# Patient Record
Sex: Female | Born: 1935 | Race: White | Hispanic: No | Marital: Single | Smoking: Never smoker
Health system: Southern US, Community
[De-identification: ages and names within clinical notes are randomized; demographics above are authoritative.]

## PROBLEM LIST (undated history)

## (undated) DIAGNOSIS — M1990 Unspecified osteoarthritis, unspecified site: Secondary | ICD-10-CM

## (undated) DIAGNOSIS — E119 Type 2 diabetes mellitus without complications: Secondary | ICD-10-CM

## (undated) DIAGNOSIS — F419 Anxiety disorder, unspecified: Secondary | ICD-10-CM

## (undated) DIAGNOSIS — I10 Essential (primary) hypertension: Secondary | ICD-10-CM

## (undated) DIAGNOSIS — G4730 Sleep apnea, unspecified: Secondary | ICD-10-CM

## (undated) HISTORY — DX: Unspecified osteoarthritis, unspecified site: M19.90

## (undated) HISTORY — DX: Essential (primary) hypertension: I10

## (undated) HISTORY — PX: TONSILLECTOMY AND ADENOIDECTOMY: SUR1326

## (undated) HISTORY — DX: Type 2 diabetes mellitus without complications: E11.9

## (undated) HISTORY — PX: TUBAL LIGATION: SHX77

## (undated) HISTORY — DX: Anxiety disorder, unspecified: F41.9

## (undated) HISTORY — PX: REPLACEMENT TOTAL KNEE: SUR1224

## (undated) HISTORY — DX: Sleep apnea, unspecified: G47.30

## (undated) HISTORY — PX: APPENDECTOMY: SHX54

## (undated) HISTORY — PX: CATARACT EXTRACTION, BILATERAL: SHX1313

---

## 2005-04-13 ENCOUNTER — Ambulatory Visit: Payer: Self-pay | Admitting: Internal Medicine

## 2005-11-05 ENCOUNTER — Emergency Department: Payer: Self-pay | Admitting: Emergency Medicine

## 2005-11-05 ENCOUNTER — Other Ambulatory Visit: Payer: Self-pay

## 2005-11-12 ENCOUNTER — Emergency Department: Payer: Self-pay | Admitting: Emergency Medicine

## 2006-12-15 ENCOUNTER — Ambulatory Visit: Payer: Self-pay | Admitting: Internal Medicine

## 2007-04-11 ENCOUNTER — Encounter: Payer: Self-pay | Admitting: Orthopedic Surgery

## 2007-04-27 ENCOUNTER — Encounter: Payer: Self-pay | Admitting: Orthopedic Surgery

## 2007-05-28 ENCOUNTER — Encounter: Payer: Self-pay | Admitting: Orthopedic Surgery

## 2007-06-27 ENCOUNTER — Encounter: Payer: Self-pay | Admitting: Orthopedic Surgery

## 2007-12-21 ENCOUNTER — Ambulatory Visit: Payer: Self-pay | Admitting: Internal Medicine

## 2009-02-14 ENCOUNTER — Ambulatory Visit: Payer: Self-pay | Admitting: Internal Medicine

## 2010-03-20 ENCOUNTER — Ambulatory Visit: Payer: Self-pay | Admitting: Internal Medicine

## 2010-04-09 ENCOUNTER — Observation Stay: Payer: Self-pay | Admitting: Internal Medicine

## 2010-05-21 ENCOUNTER — Emergency Department: Payer: Self-pay | Admitting: Emergency Medicine

## 2010-05-29 ENCOUNTER — Emergency Department (HOSPITAL_COMMUNITY): Admission: EM | Admit: 2010-05-29 | Discharge: 2010-05-29 | Payer: Self-pay | Admitting: Emergency Medicine

## 2010-05-31 ENCOUNTER — Ambulatory Visit: Payer: Self-pay | Admitting: Internal Medicine

## 2010-06-11 ENCOUNTER — Ambulatory Visit: Payer: Self-pay

## 2010-10-08 LAB — POCT I-STAT, CHEM 8
BUN: 6 mg/dL (ref 6–23)
Calcium, Ion: 1.29 mmol/L (ref 1.12–1.32)
Chloride: 107 mEq/L (ref 96–112)
Creatinine, Ser: 0.9 mg/dL (ref 0.4–1.2)
Glucose, Bld: 102 mg/dL — ABNORMAL HIGH (ref 70–99)

## 2010-10-08 LAB — URINALYSIS, ROUTINE W REFLEX MICROSCOPIC
Ketones, ur: NEGATIVE mg/dL
Nitrite: NEGATIVE
Protein, ur: NEGATIVE mg/dL
Urobilinogen, UA: 0.2 mg/dL (ref 0.0–1.0)
pH: 6.5 (ref 5.0–8.0)

## 2011-03-23 IMAGING — CT CT HEAD WITHOUT CONTRAST
2 series · 15 of 30 positions shown, 19 images · non-contrast
Comparison: none

REASON FOR EXAM: weakness/ syncope
COMMENTS:

PROCEDURE:     CT  - CT HEAD WITHOUT CONTRAST  - April 09, 2010  [DATE]
RESULT:     Comparison:  None
TECHNIQUE: Multiple axial images from the foramen magnum to the vertex were
obtained without IV contrast.

[Series 2: without · axial · non-contrast · 0.44mm/px · z∈[-175,-50]mm · 13 of 31 slices shown, 17 images]
[im 3/31  brain]
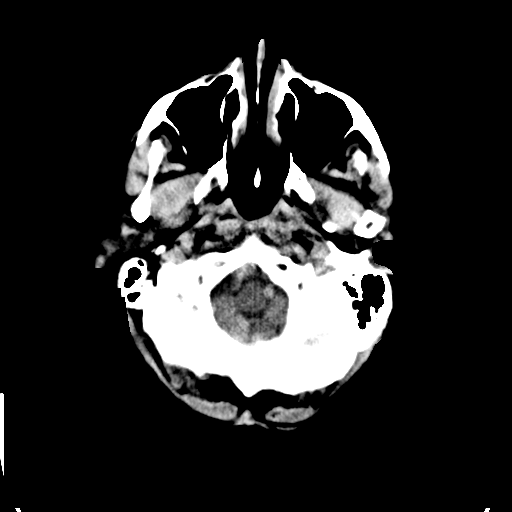
[im 3/31  bone]
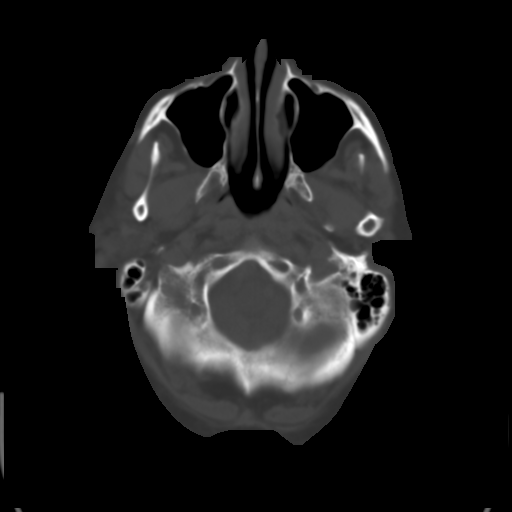
[im 5/31  brain]
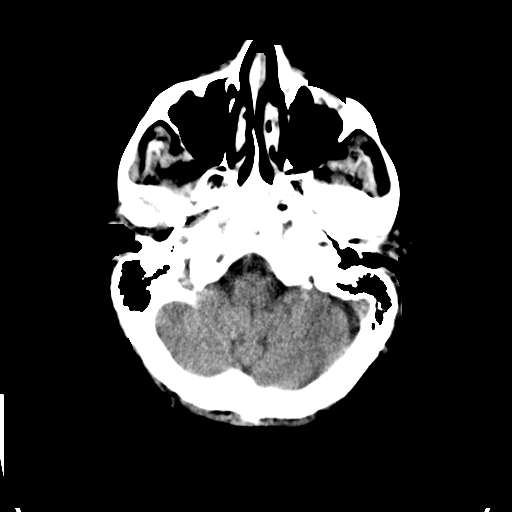
[im 7/31  brain]
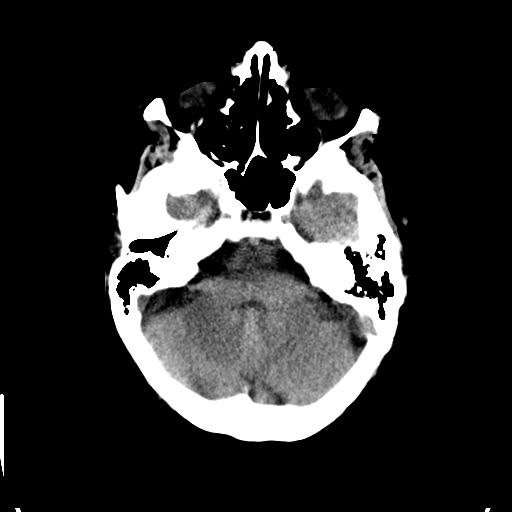
[im 9/31  brain]
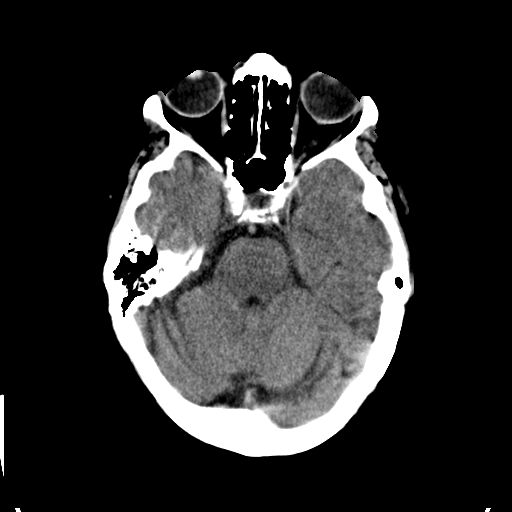
[im 11/31  brain]
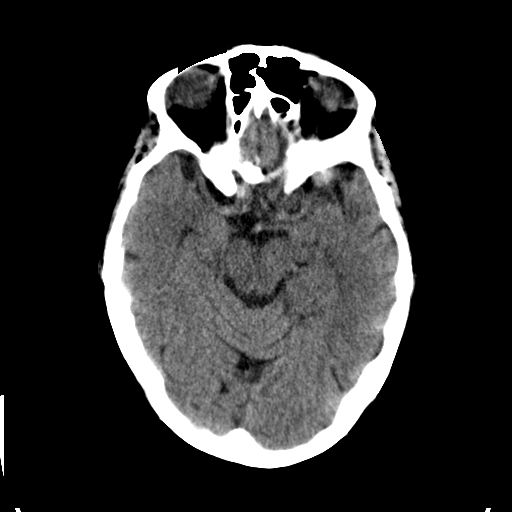
[im 11/31  bone]
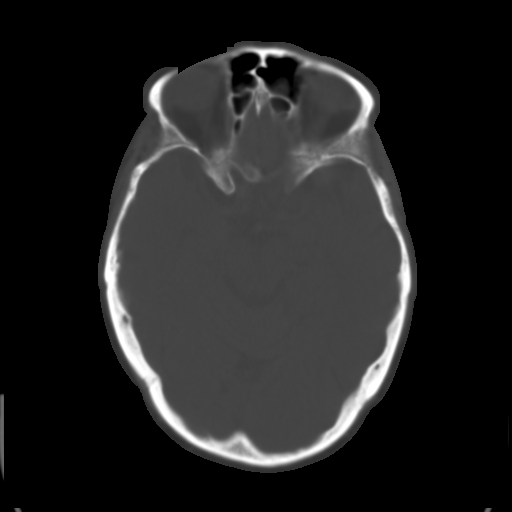
[im 13/31  brain]
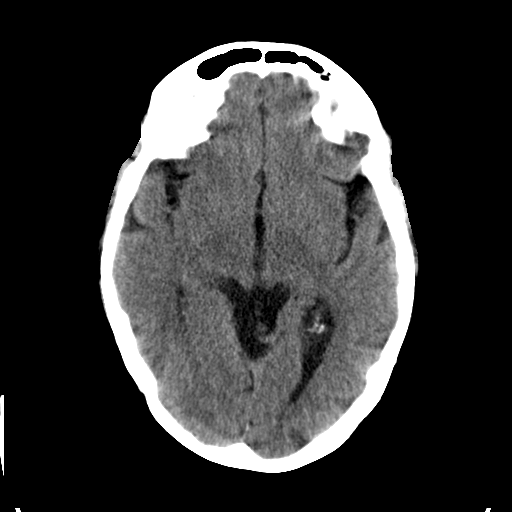
[im 16/31  brain]
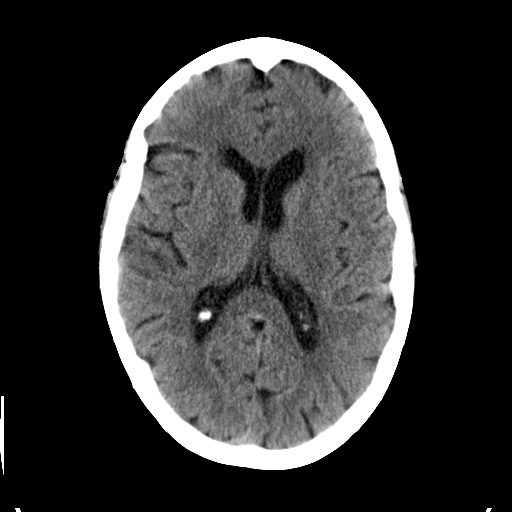
[im 18/31  brain]
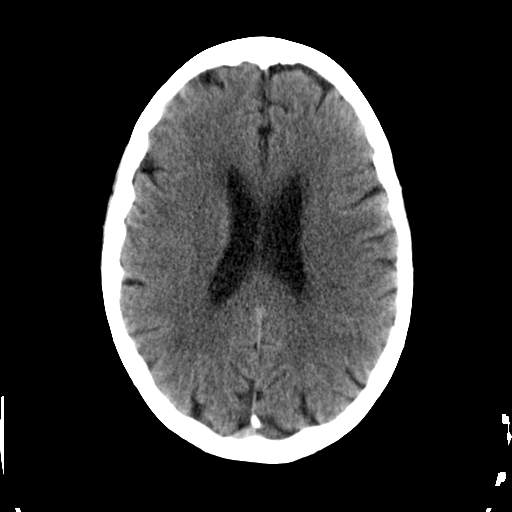
[im 20/31  brain]
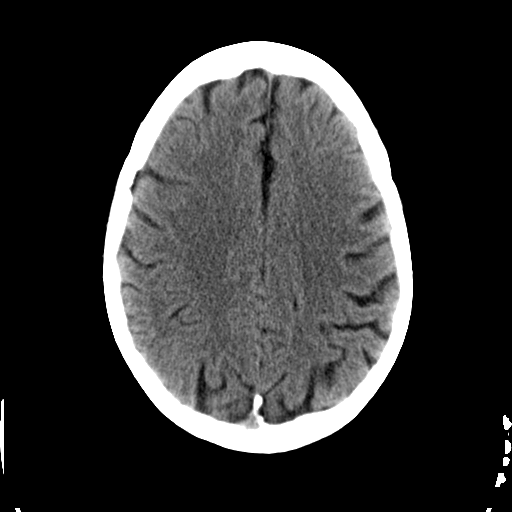
[im 20/31  bone]
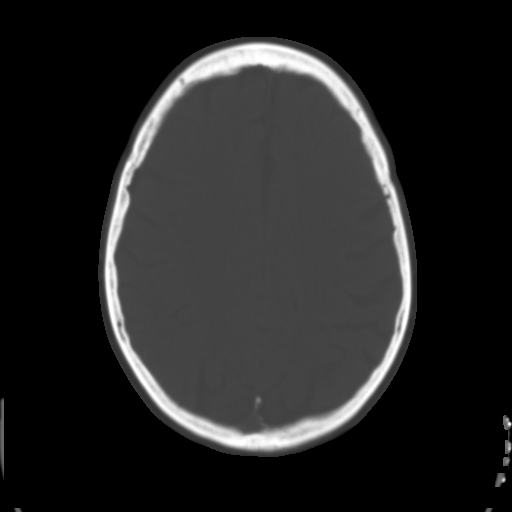
[im 22/31  brain]
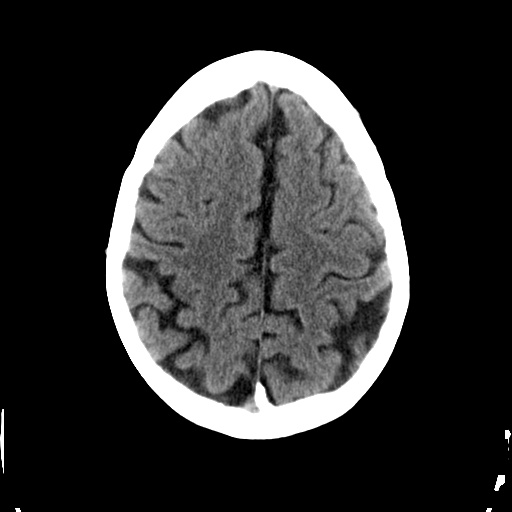
[im 24/31  brain]
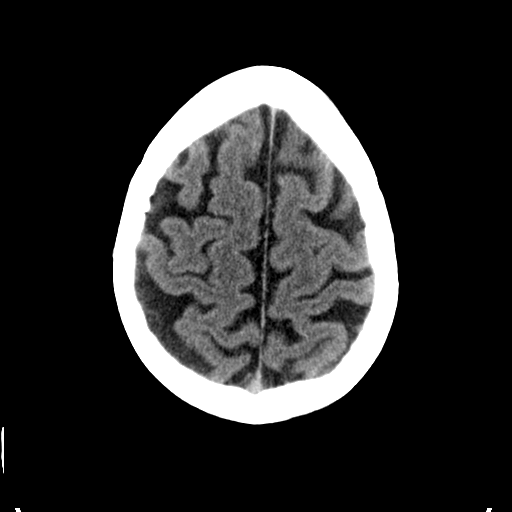
[im 26/31  brain]
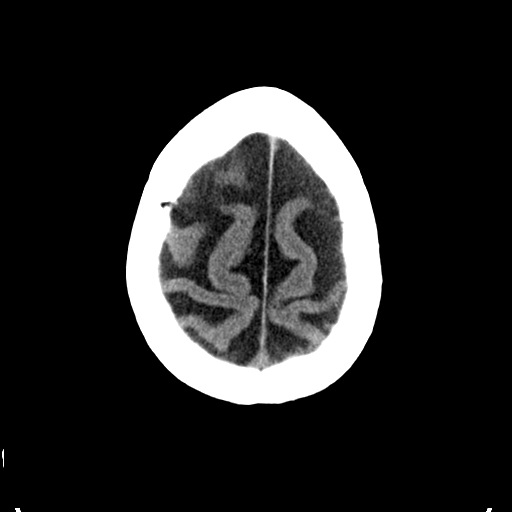
[im 28/31  brain]
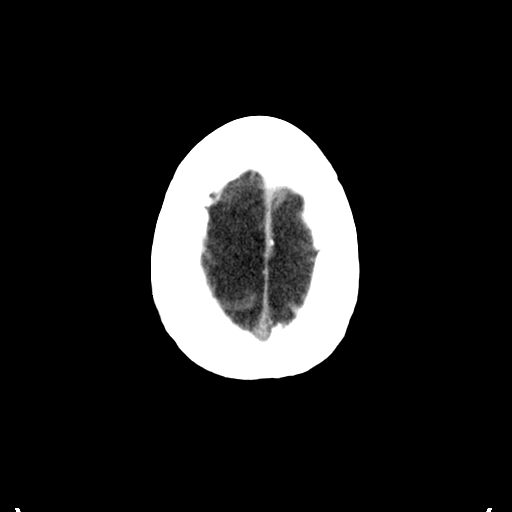
[im 28/31  bone]
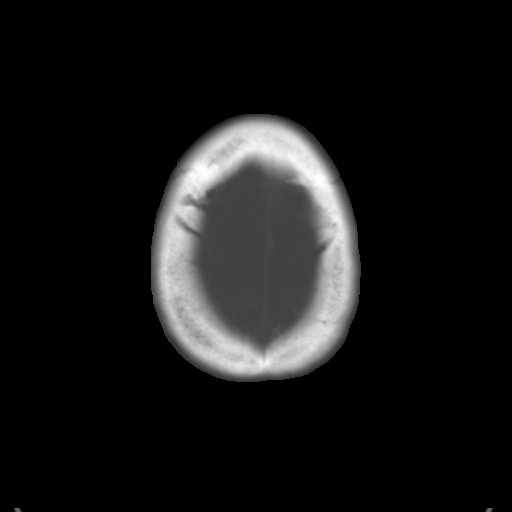

[Series 3: bone · axial · 0.44mm/px · z∈[-175,-155]mm · 2 of 31 slices shown]
[im 3/31  bone]
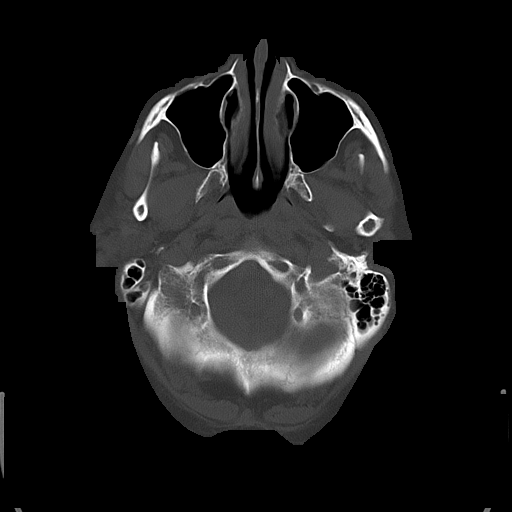
[im 7/31  bone]
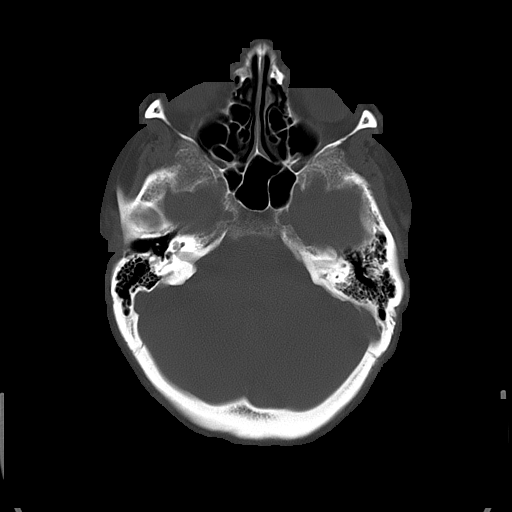

[15 of 30 positions shown; findings below may reference images not displayed]

FINDINGS: There is no evidence of mass effect, midline shift, or extra-axial fluid
collections.  There is no evidence of a space-occupying lesion or
intracranial hemorrhage. There is no evidence of a cortical-based area of
acute infarction. There is generalized cerebral atrophy. There is
periventricular white matter low attenuation likely secondary to
microangiopathy.

The ventricles and sulci are appropriate for the patient's age. The basal
cisterns are patent.

Visualized portions of the orbits are unremarkable. The visualized portions
of the paranasal sinuses and mastoid air cells are unremarkable.
Cerebrovascular atherosclerotic calcifications are noted.

The osseous structures are unremarkable.
IMPRESSION: No acute intracranial process.

## 2011-03-23 IMAGING — CT CT CHEST W/ CM
1 series · 15 of 34 positions shown, 19 images · IV contrast (APPLIED)
Comparison: None

REASON FOR EXAM: eval aorta and for pulnonary emboli
COMMENTS:

PROCEDURE:     CT  - CT CHEST (FOR PE) W  - April 09, 2010 [DATE]
RESULT:     Indications: Syncope
TECHNIQUE: A thin-section spiral CT from the lung apices to the upper
abdomen was acquired on a multi slice scanner following 100ml [id number]
intravenous contrast. These images were then transferred to the Siemens work
station and were subsequently reviewed utilizing 3-D reconstructions and MIP
images.

[Series 4: soft tissue · axial · 0.66mm/px · z∈[-834,-568]mm · 15 of 105 slices shown, 19 images]
[im 8/105  mediastinal]
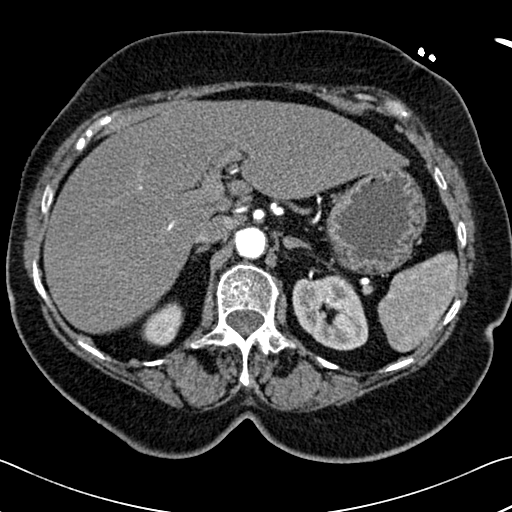
[im 8/105  lung]
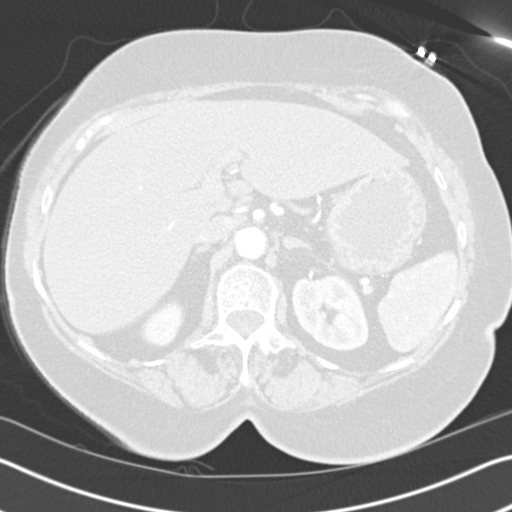
[im 16/105  lung]
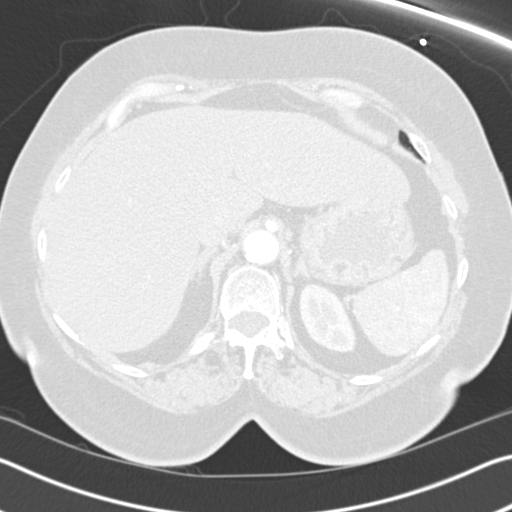
[im 21/105  lung]
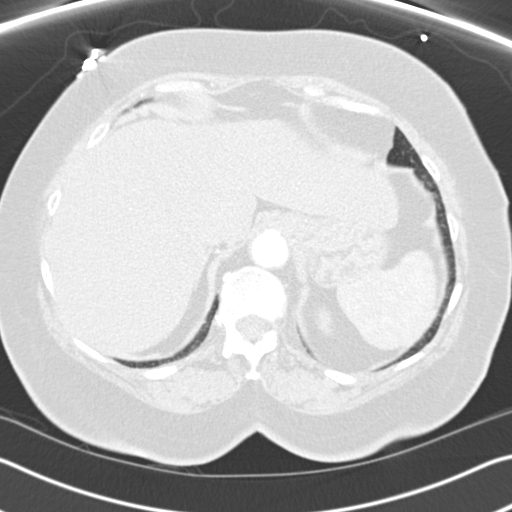
[im 27/105  lung]
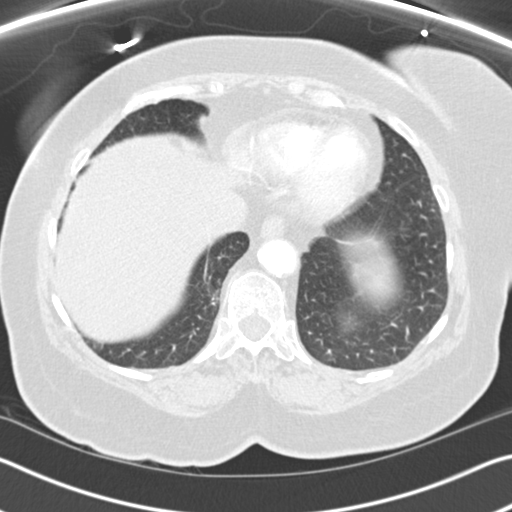
[im 35/105  mediastinal]
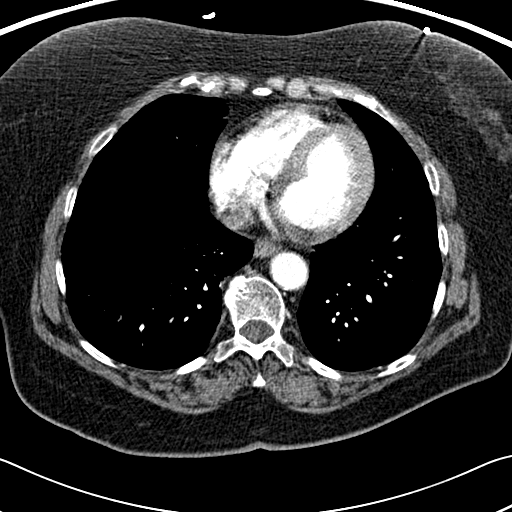
[im 35/105  lung]
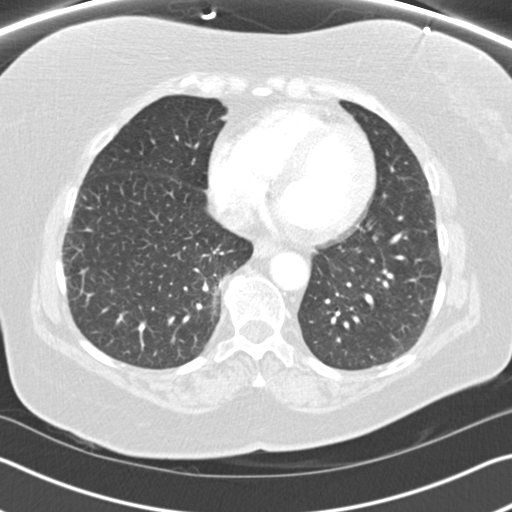
[im 42/105  lung]
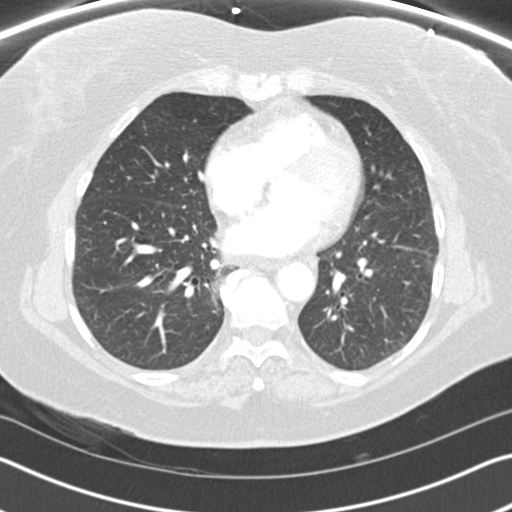
[im 47/105  lung]
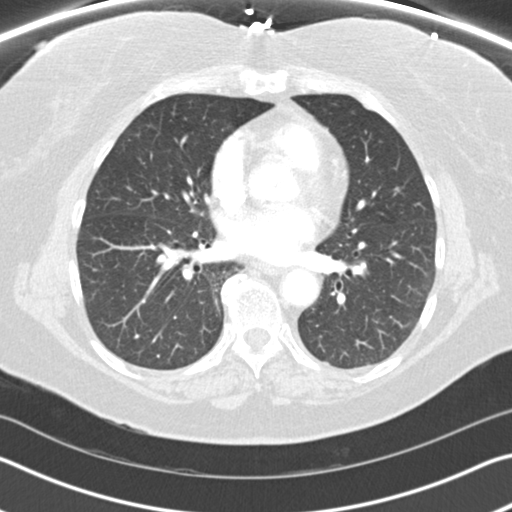
[im 54/105  lung]
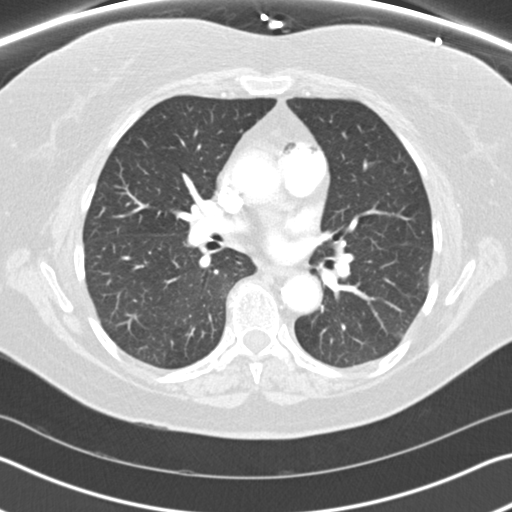
[im 58/105  mediastinal]
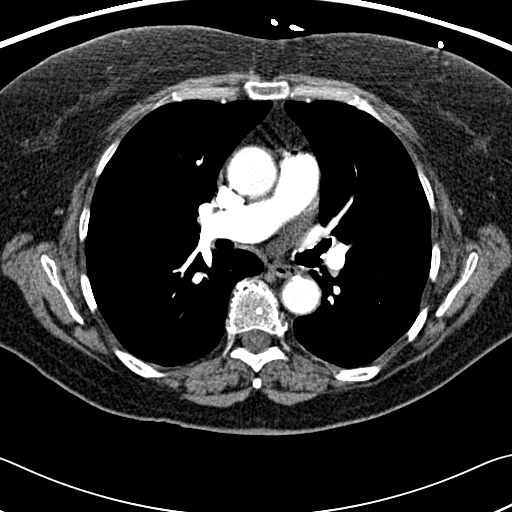
[im 58/105  lung]
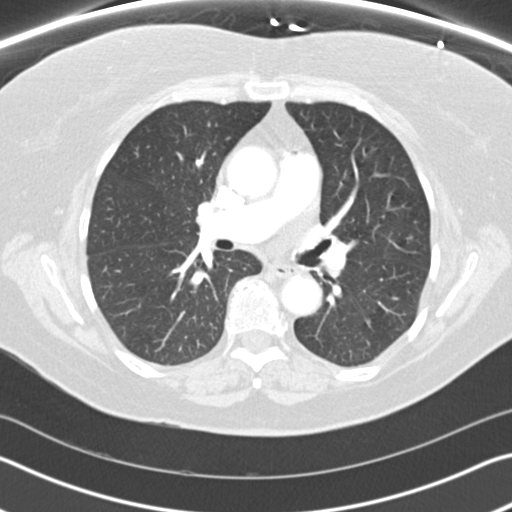
[im 63/105  lung]
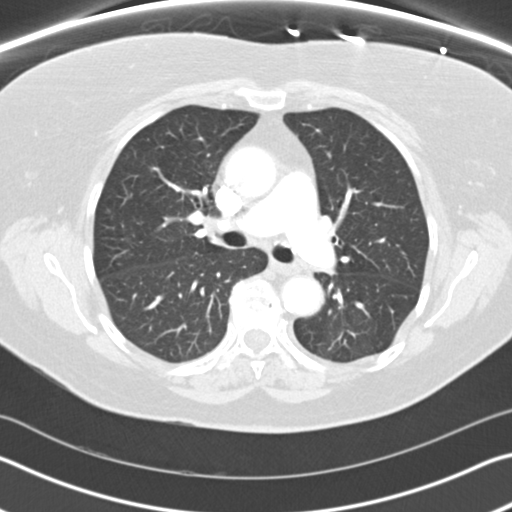
[im 70/105  lung]
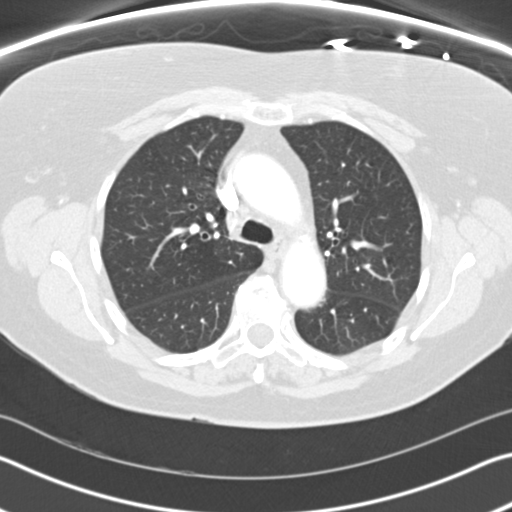
[im 78/105  lung]
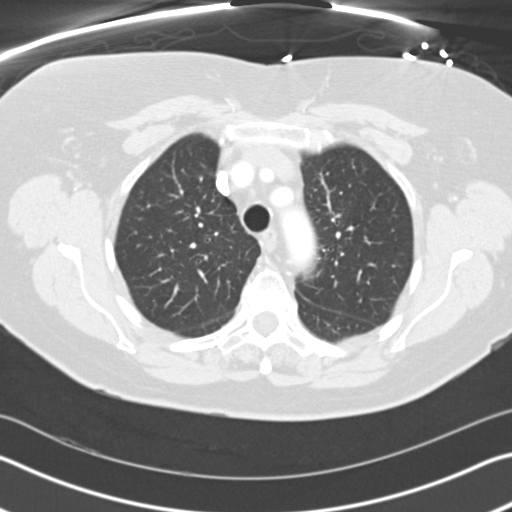
[im 84/105  mediastinal]
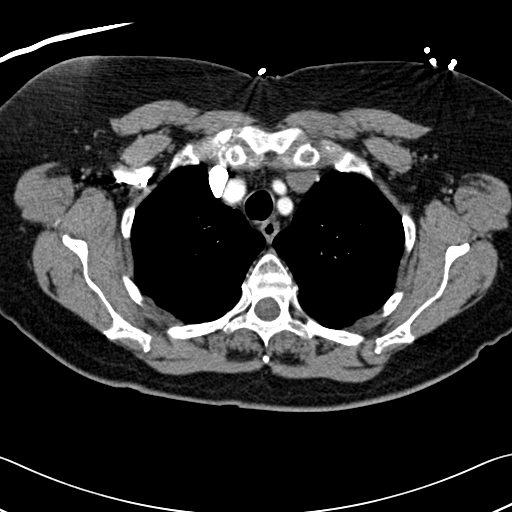
[im 84/105  lung]
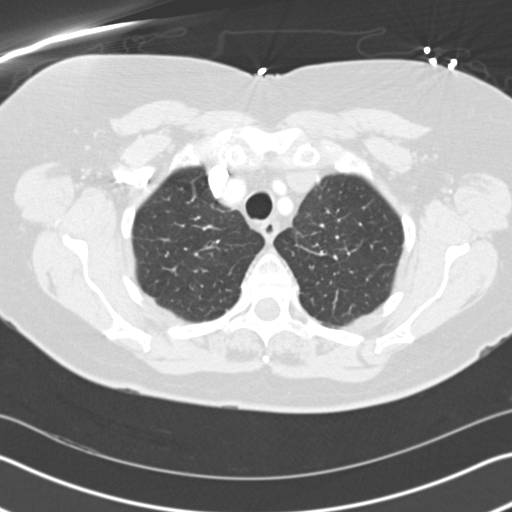
[im 89/105  lung]
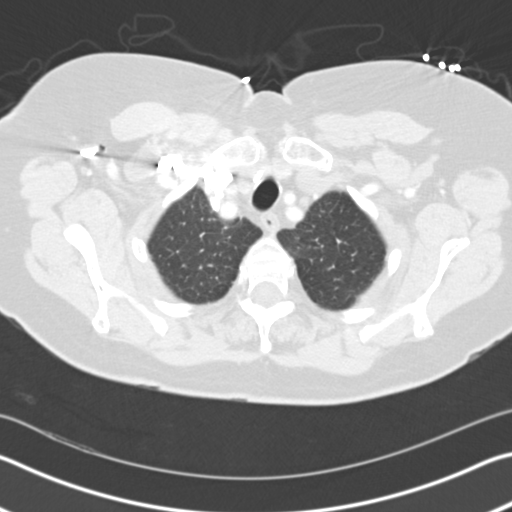
[im 97/105  lung]
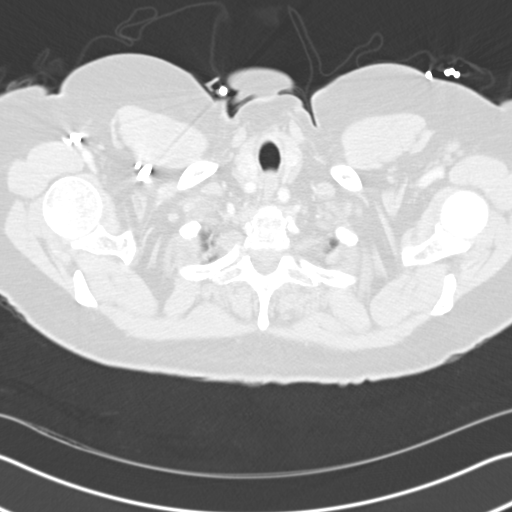

[15 of 34 positions shown; findings below may reference images not displayed]

FINDINGS: There is adequate opacification of the pulmonary arteries. There is no
pulmonary embolus. The main pulmonary artery, right main pulmonary artery,
and left main pulmonary arteries are normal in size. The heart size is
normal. There is no pericardial effusion. The thoracic aorta is normal in
caliber without evidence of a thoracic aortic dissection. There is
atherosclerosis.

The lungs are clear. There is no focal consolidation, pleural effusion, or
pneumothorax.

There is no axillary, hilar, or mediastinal adenopathy.

The osseous structures are unremarkable.

The visualized portions of the upper abdomen are unremarkable.
IMPRESSION: 1. No CT evidence of pulmonary embolus.

2. No evidence of a thoracic aortic of aneurysm or dissection.

## 2011-04-15 ENCOUNTER — Ambulatory Visit: Payer: Self-pay | Admitting: Internal Medicine

## 2011-09-11 ENCOUNTER — Ambulatory Visit: Payer: Self-pay | Admitting: Internal Medicine

## 2012-04-15 ENCOUNTER — Ambulatory Visit: Payer: Self-pay | Admitting: Internal Medicine

## 2012-07-29 ENCOUNTER — Emergency Department: Payer: Self-pay | Admitting: Emergency Medicine

## 2012-07-29 LAB — COMPREHENSIVE METABOLIC PANEL WITH GFR
Albumin: 4.1 g/dL
Alkaline Phosphatase: 68 U/L
Anion Gap: 8
BUN: 15 mg/dL
Bilirubin,Total: 0.5 mg/dL
Calcium, Total: 10.2 mg/dL — ABNORMAL HIGH
Chloride: 103 mmol/L
Co2: 26 mmol/L
Creatinine: 0.91 mg/dL
EGFR (African American): >60
EGFR (Non-African Amer.): 60 — ABNORMAL LOW
Glucose: 167 mg/dL — ABNORMAL HIGH
Osmolality: 278
Potassium: 4 mmol/L
SGOT(AST): 23 U/L
SGPT (ALT): 29 U/L
Sodium: 137 mmol/L
Total Protein: 8.2 g/dL

## 2012-07-29 LAB — URINALYSIS, COMPLETE
Bilirubin,UR: NEGATIVE
Blood: NEGATIVE
Glucose,UR: NEGATIVE mg/dL
Ketone: NEGATIVE
Nitrite: NEGATIVE
Ph: 7
Protein: NEGATIVE
RBC,UR: NONE SEEN /HPF
Specific Gravity: 1.013
Squamous Epithelial: <1
WBC UR: 15 /HPF

## 2012-07-29 LAB — CBC
HCT: 35.8 % (ref 35.0–47.0)
Platelet: 310 10*3/uL (ref 150–440)
RDW: 13.1 % (ref 11.5–14.5)

## 2013-04-17 ENCOUNTER — Ambulatory Visit: Payer: Self-pay | Admitting: Internal Medicine

## 2014-08-03 ENCOUNTER — Encounter: Payer: Self-pay | Admitting: Orthopedic Surgery

## 2014-08-27 ENCOUNTER — Encounter: Payer: Self-pay | Admitting: Orthopedic Surgery

## 2014-09-25 ENCOUNTER — Encounter
Admit: 2014-09-25 | Disposition: A | Discharge status: Home / Self Care | Payer: Self-pay | Attending: Orthopedic Surgery | Admitting: Orthopedic Surgery

## 2015-09-10 DIAGNOSIS — G4733 Obstructive sleep apnea (adult) (pediatric): Secondary | ICD-10-CM | POA: Diagnosis not present

## 2015-10-01 DIAGNOSIS — R609 Edema, unspecified: Secondary | ICD-10-CM | POA: Diagnosis not present

## 2015-10-01 DIAGNOSIS — R600 Localized edema: Secondary | ICD-10-CM | POA: Diagnosis not present

## 2015-10-01 DIAGNOSIS — M779 Enthesopathy, unspecified: Secondary | ICD-10-CM | POA: Diagnosis not present

## 2015-10-01 DIAGNOSIS — M1990 Unspecified osteoarthritis, unspecified site: Secondary | ICD-10-CM | POA: Diagnosis not present

## 2015-10-01 DIAGNOSIS — E784 Other hyperlipidemia: Secondary | ICD-10-CM | POA: Diagnosis not present

## 2015-10-01 DIAGNOSIS — R0602 Shortness of breath: Secondary | ICD-10-CM | POA: Diagnosis not present

## 2015-10-01 DIAGNOSIS — G4733 Obstructive sleep apnea (adult) (pediatric): Secondary | ICD-10-CM | POA: Diagnosis not present

## 2015-10-01 DIAGNOSIS — I10 Essential (primary) hypertension: Secondary | ICD-10-CM | POA: Diagnosis not present

## 2015-10-01 DIAGNOSIS — E669 Obesity, unspecified: Secondary | ICD-10-CM | POA: Diagnosis not present

## 2015-10-22 DIAGNOSIS — G4733 Obstructive sleep apnea (adult) (pediatric): Secondary | ICD-10-CM | POA: Diagnosis not present

## 2015-10-22 DIAGNOSIS — Z9989 Dependence on other enabling machines and devices: Secondary | ICD-10-CM | POA: Diagnosis not present

## 2015-10-30 DIAGNOSIS — R300 Dysuria: Secondary | ICD-10-CM | POA: Diagnosis not present

## 2015-10-30 DIAGNOSIS — R739 Hyperglycemia, unspecified: Secondary | ICD-10-CM | POA: Diagnosis not present

## 2015-11-06 DIAGNOSIS — E119 Type 2 diabetes mellitus without complications: Secondary | ICD-10-CM | POA: Diagnosis not present

## 2015-11-06 DIAGNOSIS — E782 Mixed hyperlipidemia: Secondary | ICD-10-CM | POA: Diagnosis not present

## 2015-12-12 DIAGNOSIS — G4733 Obstructive sleep apnea (adult) (pediatric): Secondary | ICD-10-CM | POA: Diagnosis not present

## 2015-12-19 DIAGNOSIS — G4733 Obstructive sleep apnea (adult) (pediatric): Secondary | ICD-10-CM | POA: Diagnosis not present

## 2016-01-13 DIAGNOSIS — G4733 Obstructive sleep apnea (adult) (pediatric): Secondary | ICD-10-CM | POA: Diagnosis not present

## 2016-01-16 ENCOUNTER — Encounter: Payer: Self-pay | Admitting: *Deleted

## 2016-01-16 ENCOUNTER — Encounter: Payer: PPO | Attending: Internal Medicine | Admitting: *Deleted

## 2016-01-16 VITALS — BP 122/60 | Ht 64.0 in | Wt 202.4 lb

## 2016-01-16 DIAGNOSIS — E119 Type 2 diabetes mellitus without complications: Secondary | ICD-10-CM | POA: Diagnosis not present

## 2016-01-16 NOTE — Patient Instructions (Addendum)
Check blood sugars 2 x day before breakfast and 2 hrs after supper 3 x week Exercise: Continue water aerobics for  60 minutes   2-3 days a week Eat 3 meals day,  1-2 snacks a day Space meals 4-6 hours apart Bring blood sugar records to the next class Call your doctor for a prescription for:  1. Meter strips (type) One Touch Verio checking  3 times per week  2. Lancets (type) One Touch Delica checking  3     times per week

## 2016-01-17 ENCOUNTER — Telehealth: Payer: Self-pay | Admitting: *Deleted

## 2016-01-17 NOTE — Telephone Encounter (Signed)
Received call from patient. She was having difficulty with her lancet device. Talked her through procedure and she put me on the phone with her daughter. While talking with her daughter she performed fingerstick blood sugar and reported reading of 132 mg/dL. They will call if they have any further questions.

## 2016-01-17 NOTE — Progress Notes (Signed)
Diabetes Self-Management Education  Visit Type: First/Initial  Appt. Start Time: 1530  Appt. End Time: 1700  01/16/2016  [name], identified by name and date of birth, is a 82 y.o. female with a diagnosis of Diabetes: Type 2.   ASSESSMENT  Blood pressure 122/60, height 5\' 4"  (1.626 m), weight 202 lb 6.4 oz (91.808 kg). Body mass index is 34.72 kg/(m^2).      Diabetes Self-Management Education - 01/16/16 1721    Visit Information   Visit Type First/Initial   Initial Visit   Diabetes Type Type 2   Are you currently following a meal plan? Yes   What type of meal plan do you follow? "cut out candy, coke and white bread"   Are you taking your medications as prescribed? Yes   Date Diagnosed per A1C  - April   Psychosocial Assessment   Patient Belief/Attitude about Diabetes Motivated to manage diabetes   Self-care barriers None   Self-management support Doctor's office;Family   Patient Concerns Nutrition/Meal planning;Medication;Glycemic Control;Monitoring;Healthy Lifestyle;Problem Solving;Weight Control   Special Needs None   Preferred Learning Style Visual   Learning Readiness Change in progress   How often do you need to have someone help you when you read instructions, pamphlets, or other written materials from your doctor or pharmacy? 1 - Never   What is the last grade level you completed in school? high school   Pre-Education Assessment   Patient understands the diabetes disease and treatment process. Needs Instruction   Patient understands incorporating nutritional management into lifestyle. Needs Instruction   Patient undertands incorporating physical activity into lifestyle. Needs Instruction   Patient understands using medications safely. Needs Instruction   Patient understands monitoring blood glucose, interpreting and using results Needs Instruction   Patient understands prevention, detection, and treatment of acute complications. Needs Instruction   Patient  understands prevention, detection, and treatment of chronic complications. Needs Instruction   Patient understands how to develop strategies to address psychosocial issues. Needs Instruction   Patient understands how to develop strategies to promote health/change behavior. Needs Instruction   Complications   Last HgB A1C per patient/outside source 7.2 %  10/30/15   How often do you check your blood sugar? 0 times/day (not testing)  Provided One Touch Verio Flex meter and instructed on use. BG upon return demonstration was 116 mg/dL at 7:844:45 pm - 4 hrs pp.    Have you had a dilated eye exam in the past 12 months? Yes   Have you had a dental exam in the past 12 months? Yes   Are you checking your feet? Yes   How many days per week are you checking your feet? 4   Dietary Intake   Breakfast oatmeal, eggs with bacon and toast; cereal   Lunch sandwich - banana and peanut butter or ham   Snack (afternoon) apple, nuts or popcorn   Dinner chicken, fish with sweet potatoes, peas, cabbage, beans, squash, onions   Beverage(s) water, coffee   Exercise   Exercise Type Moderate (swimming / aerobic walking)   How many days per week to you exercise? 3   How many minutes per day do you exercise? 60   Total minutes per week of exercise 180   Patient Education   Previous Diabetes Education No   Disease state  Definition of diabetes, type 1 and 2, and the diagnosis of diabetes   Nutrition management  Role of diet in the treatment of diabetes and the relationship between the three  main macronutrients and blood glucose level   Physical activity and exercise  Role of exercise on diabetes management, blood pressure control and cardiac health.   Monitoring Taught/evaluated SMBG meter.;Purpose and frequency of SMBG.;Identified appropriate SMBG and/or A1C goals.   Chronic complications Relationship between chronic complications and blood glucose control   Psychosocial adjustment Identified and addressed patients  feelings and concerns about diabetes   Individualized Goals (developed by patient)   Reducing Risk Improve blood sugars Decrease medications Prevent diabetes complications Lose weight Lead a healthier lifestyle Become more fit   Outcomes   Expected Outcomes Demonstrated interest in learning. Expect positive outcomes      Individualized Plan for Diabetes Self-Management Training:   Learning Objective:  Patient will have a greater understanding of diabetes self-management. Patient education plan is to attend individual and/or group sessions per assessed needs and concerns.   Plan:   Patient Instructions  Check blood sugars 2 x day before breakfast and 2 hrs after supper 3 x week Exercise: Continue water aerobics for  60 minutes   2-3 days a week Eat 3 meals day,  1-2 snacks a day Space meals 4-6 hours apart Bring blood sugar records to the next class Call your doctor for a prescription for:  1. Meter strips (type) One Touch Verio checking  3 times per week  2. Lancets (type) One Touch Delica checking  3     times per week   Expected Outcomes:  Demonstrated interest in learning. Expect positive outcomes  Education material provided:  General Meal Planning Guidelines Simple Meal Plan Meter - One Touch Verio Flex  If problems or questions, patient to contact team via:  Sharion SettlerSheila , RN, CCM, CDE [phone]) [phone]  Future DSME appointment:  January 20, 2016 for Diabetes Class 1

## 2016-01-20 ENCOUNTER — Encounter: Payer: PPO | Admitting: Dietician

## 2016-01-20 ENCOUNTER — Encounter: Payer: Self-pay | Admitting: Dietician

## 2016-01-20 VITALS — Ht 64.0 in | Wt 201.4 lb

## 2016-01-20 DIAGNOSIS — E119 Type 2 diabetes mellitus without complications: Secondary | ICD-10-CM | POA: Diagnosis not present

## 2016-01-20 NOTE — Progress Notes (Signed)

## 2016-02-03 ENCOUNTER — Encounter: Payer: PPO | Attending: Internal Medicine | Admitting: Dietician

## 2016-02-03 VITALS — Wt 198.8 lb

## 2016-02-03 DIAGNOSIS — E119 Type 2 diabetes mellitus without complications: Secondary | ICD-10-CM | POA: Insufficient documentation

## 2016-02-03 NOTE — Progress Notes (Signed)

## 2016-02-10 ENCOUNTER — Encounter: Payer: PPO | Admitting: Dietician

## 2016-02-10 ENCOUNTER — Encounter: Payer: Self-pay | Admitting: Dietician

## 2016-02-10 VITALS — BP 124/58 | Ht 64.0 in | Wt 198.4 lb

## 2016-02-10 DIAGNOSIS — E119 Type 2 diabetes mellitus without complications: Secondary | ICD-10-CM | POA: Diagnosis not present

## 2016-02-10 NOTE — Progress Notes (Signed)

## 2016-02-12 ENCOUNTER — Encounter: Payer: Self-pay | Admitting: *Deleted

## 2016-04-27 DIAGNOSIS — G4733 Obstructive sleep apnea (adult) (pediatric): Secondary | ICD-10-CM | POA: Diagnosis not present

## 2016-05-05 DIAGNOSIS — E119 Type 2 diabetes mellitus without complications: Secondary | ICD-10-CM | POA: Diagnosis not present

## 2016-05-05 DIAGNOSIS — E782 Mixed hyperlipidemia: Secondary | ICD-10-CM | POA: Diagnosis not present

## 2016-05-12 DIAGNOSIS — E119 Type 2 diabetes mellitus without complications: Secondary | ICD-10-CM | POA: Diagnosis not present

## 2016-05-12 DIAGNOSIS — Z0000 Encounter for general adult medical examination without abnormal findings: Secondary | ICD-10-CM | POA: Diagnosis not present

## 2016-05-12 DIAGNOSIS — Z23 Encounter for immunization: Secondary | ICD-10-CM | POA: Diagnosis not present

## 2016-06-01 DIAGNOSIS — Z961 Presence of intraocular lens: Secondary | ICD-10-CM | POA: Diagnosis not present

## 2016-07-31 DIAGNOSIS — G4733 Obstructive sleep apnea (adult) (pediatric): Secondary | ICD-10-CM | POA: Diagnosis not present

## 2016-08-20 DIAGNOSIS — G4733 Obstructive sleep apnea (adult) (pediatric): Secondary | ICD-10-CM | POA: Diagnosis not present

## 2016-08-27 DIAGNOSIS — G4733 Obstructive sleep apnea (adult) (pediatric): Secondary | ICD-10-CM | POA: Diagnosis not present

## 2016-09-03 ENCOUNTER — Telehealth: Payer: Self-pay | Admitting: *Deleted

## 2016-09-03 ENCOUNTER — Encounter: Payer: Self-pay | Admitting: *Deleted

## 2016-09-03 NOTE — Telephone Encounter (Signed)
Received 6 month goal sheet and patient voiced questions. Called her and discussed A1C results - from 7.2% to 6.4%. She has lost 16 lbs and [facility] [place] [facility] She asked about the 2 Hr Refresher Program. She will be mailed a reminder card in July and we can request a referral from PCP to return.

## 2016-09-29 DIAGNOSIS — M1990 Unspecified osteoarthritis, unspecified site: Secondary | ICD-10-CM | POA: Diagnosis not present

## 2016-09-29 DIAGNOSIS — R0602 Shortness of breath: Secondary | ICD-10-CM | POA: Diagnosis not present

## 2016-09-29 DIAGNOSIS — F419 Anxiety disorder, unspecified: Secondary | ICD-10-CM | POA: Diagnosis not present

## 2016-09-29 DIAGNOSIS — I10 Essential (primary) hypertension: Secondary | ICD-10-CM | POA: Diagnosis not present

## 2016-09-29 DIAGNOSIS — R600 Localized edema: Secondary | ICD-10-CM | POA: Diagnosis not present

## 2016-09-29 DIAGNOSIS — M779 Enthesopathy, unspecified: Secondary | ICD-10-CM | POA: Diagnosis not present

## 2016-09-29 DIAGNOSIS — E784 Other hyperlipidemia: Secondary | ICD-10-CM | POA: Diagnosis not present

## 2016-09-29 DIAGNOSIS — R609 Edema, unspecified: Secondary | ICD-10-CM | POA: Diagnosis not present

## 2016-09-29 DIAGNOSIS — G4733 Obstructive sleep apnea (adult) (pediatric): Secondary | ICD-10-CM | POA: Diagnosis not present

## 2016-09-29 DIAGNOSIS — E669 Obesity, unspecified: Secondary | ICD-10-CM | POA: Diagnosis not present

## 2016-10-21 DIAGNOSIS — G4733 Obstructive sleep apnea (adult) (pediatric): Secondary | ICD-10-CM | POA: Diagnosis not present

## 2016-10-21 DIAGNOSIS — Z9989 Dependence on other enabling machines and devices: Secondary | ICD-10-CM | POA: Diagnosis not present

## 2016-11-03 DIAGNOSIS — R300 Dysuria: Secondary | ICD-10-CM | POA: Diagnosis not present

## 2016-11-03 DIAGNOSIS — E119 Type 2 diabetes mellitus without complications: Secondary | ICD-10-CM | POA: Diagnosis not present

## 2016-11-10 DIAGNOSIS — E119 Type 2 diabetes mellitus without complications: Secondary | ICD-10-CM | POA: Diagnosis not present

## 2016-11-10 DIAGNOSIS — Z79899 Other long term (current) drug therapy: Secondary | ICD-10-CM | POA: Diagnosis not present

## 2016-11-10 DIAGNOSIS — Z0000 Encounter for general adult medical examination without abnormal findings: Secondary | ICD-10-CM | POA: Diagnosis not present

## 2016-11-10 DIAGNOSIS — I10 Essential (primary) hypertension: Secondary | ICD-10-CM | POA: Diagnosis not present

## 2016-11-30 DIAGNOSIS — G4733 Obstructive sleep apnea (adult) (pediatric): Secondary | ICD-10-CM | POA: Diagnosis not present

## 2017-03-10 DIAGNOSIS — G4733 Obstructive sleep apnea (adult) (pediatric): Secondary | ICD-10-CM | POA: Diagnosis not present

## 2017-03-26 ENCOUNTER — Encounter: Payer: Self-pay | Admitting: *Deleted

## 2017-03-26 ENCOUNTER — Encounter: Payer: PPO | Attending: Internal Medicine | Admitting: *Deleted

## 2017-03-26 VITALS — BP 128/62 | Ht 65.0 in | Wt 200.5 lb

## 2017-03-26 DIAGNOSIS — Z713 Dietary counseling and surveillance: Secondary | ICD-10-CM | POA: Diagnosis not present

## 2017-03-26 DIAGNOSIS — E119 Type 2 diabetes mellitus without complications: Secondary | ICD-10-CM | POA: Diagnosis not present

## 2017-03-26 NOTE — Progress Notes (Signed)
Diabetes Self-Management Education  Visit Type: First/Initial  Appt. Start Time: 1110 Appt. End Time: 1225  03/26/2017  [name], identified by name and date of birth, is a 83 y.o. female with a diagnosis of Diabetes: Type 2.   ASSESSMENT  Blood pressure 128/62, height 5\' 5"  (1.651 m), weight 200 lb 8 oz (90.9 kg). Body mass index is 33.36 kg/m.      Diabetes Self-Management Education - 03/26/17 1304      Visit Information   Visit Type First/Initial     Initial Visit   Diabetes Type Type 2   Are you currently following a meal plan? Yes   What type of meal plan do you follow? "try to cook healthy but I love sugar - that is hard to resist"   Are you taking your medications as prescribed? Yes   Date Diagnosed 2 years ago     Health Coping   How would you rate your overall health? Good     Psychosocial Assessment   Patient Belief/Attitude about Diabetes Motivated to manage diabetes  "kinda sad"   Self-care barriers None   Self-management support Doctor's office;Family   Patient Concerns Nutrition/Meal planning;Glycemic Control;Medication;Weight Control   Special Needs None   Preferred Learning Style Visual;Auditory   Learning Readiness Ready   How often do you need to have someone help you when you read instructions, pamphlets, or other written materials from your doctor or pharmacy? 1 - Never   What is the last grade level you completed in school? 12th     Pre-Education Assessment   Patient understands the diabetes disease and treatment process. Needs Review   Patient understands incorporating nutritional management into lifestyle. Needs Review   Patient undertands incorporating physical activity into lifestyle. Demonstrates understanding / competency   Patient understands using medications safely. Needs Review   Patient understands monitoring blood glucose, interpreting and using results Needs Review   Patient understands prevention, detection, and treatment of  acute complications. Needs Review   Patient understands prevention, detection, and treatment of chronic complications. Needs Review   Patient understands how to develop strategies to address psychosocial issues. Needs Review   Patient understands how to develop strategies to promote health/change behavior. Needs Review     Complications   Last HgB A1C per patient/outside source 6.7 %  11/03/16   How often do you check your blood sugar? 1-2 times/day   Fasting Blood glucose range (mg/dL) 16-109;604-540  FBG's range from 110-160's mg/dL.    Postprandial Blood glucose range (mg/dL) 981-191;478-295;>621  pp's 134-190's mg/dL with 2 readings of 308 and 228 mg/dL due to food choices (desserts).    Have you had a dilated eye exam in the past 12 months? Yes   Have you had a dental exam in the past 12 months? Yes   Are you checking your feet? Yes   How many days per week are you checking your feet? 7     Dietary Intake   Breakfast 5:00 am - graham crackers and peanut butter   Snack (morning) 9:00 am - oatmeal with a little honey and cranberries or 1/2 banana or raisins; egg and wheat toast   Lunch leftovers of meat and vegetables; salad; peanut butter and jelly sandwich   Snack (afternoon) graham crackers and peanut butter or fruit - oranges, apples, kiwi, bananas   Dinner chicken, fish, beef with cabbage, turnip greens, corn bread, sweet or white potatoes, beans, peas, broccoli   Beverage(s) water, coffee, fruit juice, occasional  sweet tea     Exercise   Exercise Type Moderate (swimming / aerobic walking)  water aerobics   How many days per week to you exercise? 3   How many minutes per day do you exercise? 60   Total minutes per week of exercise 180     Patient Education   Previous Diabetes Education Yes (please comment)  had diabetes classes last year   Disease state  Explored patient's options for treatment of their diabetes   Nutrition management  Role of diet in the treatment of  diabetes and the relationship between the three main macronutrients and blood glucose level;Food label reading, portion sizes and measuring food.;Reviewed blood glucose goals for pre and post meals and how to evaluate the patients' food intake on their blood glucose level.   Physical activity and exercise  Role of exercise on diabetes management, blood pressure control and cardiac health.   Monitoring Purpose and frequency of SMBG.;Taught/discussed recording of test results and interpretation of SMBG.;Identified appropriate SMBG and/or A1C goals.   Chronic complications Relationship between chronic complications and blood glucose control   Psychosocial adjustment Identified and addressed patients feelings and concerns about diabetes     Individualized Goals (developed by patient)   Reducing Risk Improve blood sugars Decrease medications Lose weight Become more fit     Outcomes   Expected Outcomes Demonstrated interest in learning. Expect positive outcomes   Future DMSE Other  3 weeks      Individualized Plan for Diabetes Self-Management Training:   Learning Objective:  Patient will have a greater understanding of diabetes self-management. Patient education plan is to attend individual and/or group sessions per assessed needs and concerns.   Plan:   Patient Instructions  Check blood sugars 1-2 x day before breakfast and/or 2 hrs after supper every day Bring blood sugar records or meter to the next appointment Exercise: Continue water aerobics for  60  minutes  3  days a week Eat 3 meals day,   1-2  snacks a day Space meals 4-6 hours apart Limit sugar sweetened drinks (juices) Complete 3 Day Food Record and bring to next appt Return for appointment on:  Tuesday April 13, 2017 at 1:15 pm with Jill Sideolleen (dietitian)  Expected Outcomes:  Demonstrated interest in learning. Expect positive outcomes  Education material provided:  General Meal Planning Guidelines Simple Meal Plan 3  Day Food Record  If problems or questions, patient to contact team via:  Sharion SettlerSheila , RN, CCM, CDE [phone]  Future DSME appointment:  (3 weeks) April 13, 2017 with the dietitian

## 2017-03-26 NOTE — Patient Instructions (Addendum)
Check blood sugars 1-2 x day before breakfast and/or 2 hrs after supper every day Bring blood sugar records or meter to the next appointment  Exercise: Continue water aerobics for  60  minutes  3  days a week  Eat 3 meals day,   1-2  snacks a day Space meals 4-6 hours apart Limit sugar sweetened drinks (juices) Complete 3 Day Food Record and bring to next appt  Return for appointment on:  Tuesday April 13, 2017 at 1:15 pm with Jill Sideolleen (dietitian)

## 2017-04-13 ENCOUNTER — Ambulatory Visit: Payer: Self-pay | Admitting: Dietician

## 2017-04-19 ENCOUNTER — Encounter: Payer: Self-pay | Admitting: Dietician

## 2017-04-19 ENCOUNTER — Encounter: Payer: PPO | Attending: Internal Medicine | Admitting: Dietician

## 2017-04-19 VITALS — BP 118/64 | Wt 198.5 lb

## 2017-04-19 DIAGNOSIS — Z713 Dietary counseling and surveillance: Secondary | ICD-10-CM | POA: Diagnosis not present

## 2017-04-19 DIAGNOSIS — E119 Type 2 diabetes mellitus without complications: Secondary | ICD-10-CM

## 2017-04-19 NOTE — Progress Notes (Signed)
Diabetes Self-Management Education  Visit Type:  Follow-up  Appt. Start Time:1330  Appt. End Time: 1445  04/19/2017  [name], identified by name and date of birth, is a 83 y.o. female with a diagnosis of Diabetes: Type 2 Diabetes ASSESSMENT  Blood pressure 118/64, weight 198 lb 8 oz (90 kg). Body mass index is 33.03 kg/m.       Diabetes Self-Management Education - 04/19/17 1503      Complications   Last HgB A1C per patient/outside source 6.7 %   How often do you check your blood sugar? 1-2 times/day   Fasting Blood glucose range (mg/dL) 16-109;604-540   Postprandial Blood glucose range (mg/dL) 981-191;478-295   Have you had a dilated eye exam in the past 12 months? Yes   Have you had a dental exam in the past 12 months? Yes   Are you checking your feet? Yes   How many days per week are you checking your feet? 7     Dietary Intake   Breakfast 2 cups coffee with cream; no sugar, 4 graham crackers, 2 Tbsp peanut butter   Lunch cube steak, 1/2c squash, 1/2 c. mashed potatoes, 1/2c coleslaw, corn, bread, water or 1c. cheese soup, crackers, 1/2 chicken salad sandwich as examples   Dinner fish-fried using veg. oil spray, small potato, cole slaw, cookie or chicken tenders fried with veg. oil spray, 1 c. green beans, 1 cup hash brown casserole, 2 smal rolls   Snack (evening) peanut butter/crackers   Beverage(s) water, occasional tea with sugar, sugar free lemonade     Exercise   Exercise Type Light (walking / raking leaves)  water aerobics   How many days per week to you exercise? 2   How many minutes per day do you exercise? 60   Total minutes per week of exercise 120     Patient Education   Nutrition management  Role of diet in the treatment of diabetes and the relationship between the three main macronutrients and blood glucose level;Food label reading, portion sizes and measuring food.;Reviewed blood glucose goals for pre and post meals and how to evaluate the patients'  food intake on their blood glucose level.;Information on hints to eating out and maintain blood glucose control.;Carbohydrate counting   Physical activity and exercise  Role of exercise on diabetes management, blood pressure control and cardiac health.     Post-Education Assessment   Patient understands the diabetes disease and treatment process. Demonstrates understanding / competency   Patient understands incorporating nutritional management into lifestyle. Demonstrates understanding / competency   Patient undertands incorporating physical activity into lifestyle. Demonstrates understanding / competency   Patient understands monitoring blood glucose, interpreting and using results Demonstrates understanding / competency   Patient understands prevention, detection, and treatment of acute complications. Demonstrates understanding / competency   Patient understands prevention, detection, and treatment of chronic complications. Demonstrates understanding / competency   Patient understands how to develop strategies to address psychosocial issues. Demonstrates understanding / competency   Patient understands how to develop strategies to promote health/change behavior. Demonstrates understanding / competency     Outcomes   Program Status Completed      Learning Objective:  Patient will have a greater understanding of diabetes self-management. Patient education plan is to attend individual and/or group sessions per assessed needs and concerns.   Plan:   Patient Instructions  Balance meals with protein, 2-3 servings of carbohydrate (starch, fruit, milk/yogurt), and "free vegetables". Include a bedtime snack, only if hungry. Limit  to 1 serving of carbohydrate and 1ounce of protein. Occasionally include desserts to help with "sweet tooth" but adjust starch at meal to 1 serving. Count  1 cup of casserole as 2 servings of carbohydrate; best to only have 2 servings of carbohydrate at meals with  casseroles. Continue with water aerobics as often as possible.    Expected Outcomes:  Demonstrated interest in learning. Expect positive outcomes  Education material provided: Diabetes Food Guide Plate, Planning a Balanced Meal, Sample menus Glucose log books  If problems or questions, patient to contact team via:  Jenny Pierce   [phone]  Future DSME appointment: - PRN

## 2017-04-19 NOTE — Patient Instructions (Signed)
Balance meals with protein, 2-3 servings of carbohydrate (starch, fruit, milk/yogurt), and "free vegetables". Include a bedtime snack, only if hungry. Limit to 1 serving of carbohydrate and 1ounce of protein. Occasionally include desserts to help with "sweet tooth" but adjust starch at meal to 1 serving. Count  1 cup of casserole as 2 servings of carbohydrate; best to only have 2 servings of carbohydrate at meals with casseroles. Continue with water aerobics as often as possible.

## 2017-05-19 DIAGNOSIS — Z79899 Other long term (current) drug therapy: Secondary | ICD-10-CM | POA: Diagnosis not present

## 2017-05-19 DIAGNOSIS — E119 Type 2 diabetes mellitus without complications: Secondary | ICD-10-CM | POA: Diagnosis not present

## 2017-05-27 DIAGNOSIS — Z0000 Encounter for general adult medical examination without abnormal findings: Secondary | ICD-10-CM | POA: Diagnosis not present

## 2017-05-27 DIAGNOSIS — E782 Mixed hyperlipidemia: Secondary | ICD-10-CM | POA: Diagnosis not present

## 2017-05-27 DIAGNOSIS — E1169 Type 2 diabetes mellitus with other specified complication: Secondary | ICD-10-CM | POA: Diagnosis not present

## 2017-07-30 DIAGNOSIS — G4733 Obstructive sleep apnea (adult) (pediatric): Secondary | ICD-10-CM | POA: Diagnosis not present

## 2017-08-09 DIAGNOSIS — Z961 Presence of intraocular lens: Secondary | ICD-10-CM | POA: Diagnosis not present

## 2017-08-12 DIAGNOSIS — G4733 Obstructive sleep apnea (adult) (pediatric): Secondary | ICD-10-CM | POA: Diagnosis not present

## 2017-08-21 ENCOUNTER — Emergency Department
Admission: EM | Admit: 2017-08-21 | Discharge: 2017-08-21 | Disposition: A | Discharge status: Home / Self Care | Payer: No Typology Code available for payment source | Attending: Emergency Medicine | Admitting: Emergency Medicine

## 2017-08-21 ENCOUNTER — Emergency Department: Payer: No Typology Code available for payment source

## 2017-08-21 DIAGNOSIS — R079 Chest pain, unspecified: Secondary | ICD-10-CM | POA: Diagnosis present

## 2017-08-21 DIAGNOSIS — Z96653 Presence of artificial knee joint, bilateral: Secondary | ICD-10-CM | POA: Diagnosis not present

## 2017-08-21 DIAGNOSIS — Y998 Other external cause status: Secondary | ICD-10-CM | POA: Diagnosis not present

## 2017-08-21 DIAGNOSIS — Z79899 Other long term (current) drug therapy: Secondary | ICD-10-CM | POA: Insufficient documentation

## 2017-08-21 DIAGNOSIS — I10 Essential (primary) hypertension: Secondary | ICD-10-CM | POA: Diagnosis not present

## 2017-08-21 DIAGNOSIS — Z7982 Long term (current) use of aspirin: Secondary | ICD-10-CM | POA: Diagnosis not present

## 2017-08-21 DIAGNOSIS — S299XXA Unspecified injury of thorax, initial encounter: Secondary | ICD-10-CM | POA: Diagnosis not present

## 2017-08-21 DIAGNOSIS — Y9389 Activity, other specified: Secondary | ICD-10-CM | POA: Insufficient documentation

## 2017-08-21 DIAGNOSIS — R0789 Other chest pain: Secondary | ICD-10-CM | POA: Diagnosis not present

## 2017-08-21 DIAGNOSIS — E119 Type 2 diabetes mellitus without complications: Secondary | ICD-10-CM | POA: Insufficient documentation

## 2017-08-21 DIAGNOSIS — F419 Anxiety disorder, unspecified: Secondary | ICD-10-CM | POA: Diagnosis not present

## 2017-08-21 LAB — CBC
HCT: 38.5 % (ref 35.0–47.0)
Hemoglobin: 13.1 g/dL (ref 12.0–16.0)
MCH: 30.4 pg (ref 26.0–34.0)
MCHC: 33.9 g/dL (ref 32.0–36.0)
MCV: 89.6 fL (ref 80.0–100.0)
PLATELETS: 151 10*3/uL (ref 150–440)
RBC: 4.3 MIL/uL (ref 3.80–5.20)
RDW: 12.8 % (ref 11.5–14.5)
WBC: 11.4 10*3/uL — ABNORMAL HIGH (ref 3.6–11.0)

## 2017-08-21 LAB — TROPONIN I

## 2017-08-21 LAB — BASIC METABOLIC PANEL
Anion gap: 10 (ref 5–15)
BUN: 18 mg/dL (ref 6–20)
CALCIUM: 11 mg/dL — AB (ref 8.9–10.3)
CHLORIDE: 99 mmol/L — AB (ref 101–111)
CO2: 26 mmol/L (ref 22–32)
CREATININE: 0.98 mg/dL (ref 0.44–1.00)
GFR calc non Af Amer: 51 mL/min — ABNORMAL LOW (ref >60)
GFR, EST AFRICAN AMERICAN: 59 mL/min — AB (ref >60)
GLUCOSE: 136 mg/dL — AB (ref 65–99)
Potassium: 4.2 mmol/L (ref 3.5–5.1)
Sodium: 135 mmol/L (ref 135–145)

## 2017-08-21 MED ORDER — TRAMADOL HCL 50 MG PO TABS
50.0000 mg | ORAL_TABLET | Freq: Once | ORAL | Status: AC
  Administered 2017-08-21: 50 mg via ORAL
  Filled 2017-08-21: qty 1
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
## 2017-08-21 NOTE — ED Notes (Signed)
Attempted discharge signature but pad not working.  Attempted multiple times.  Patient understands discharge instructions and has no further questions at this time.

## 2017-09-21 DIAGNOSIS — G4733 Obstructive sleep apnea (adult) (pediatric): Secondary | ICD-10-CM | POA: Diagnosis not present

## 2017-10-08 DIAGNOSIS — E1169 Type 2 diabetes mellitus with other specified complication: Secondary | ICD-10-CM | POA: Diagnosis not present

## 2017-10-08 DIAGNOSIS — F419 Anxiety disorder, unspecified: Secondary | ICD-10-CM | POA: Diagnosis not present

## 2017-10-08 DIAGNOSIS — E782 Mixed hyperlipidemia: Secondary | ICD-10-CM | POA: Diagnosis not present

## 2017-10-08 DIAGNOSIS — E669 Obesity, unspecified: Secondary | ICD-10-CM | POA: Diagnosis not present

## 2017-10-08 DIAGNOSIS — E7849 Other hyperlipidemia: Secondary | ICD-10-CM | POA: Diagnosis not present

## 2017-10-08 DIAGNOSIS — R600 Localized edema: Secondary | ICD-10-CM | POA: Diagnosis not present

## 2017-10-08 DIAGNOSIS — I10 Essential (primary) hypertension: Secondary | ICD-10-CM | POA: Diagnosis not present

## 2017-10-08 DIAGNOSIS — R0602 Shortness of breath: Secondary | ICD-10-CM | POA: Diagnosis not present

## 2017-10-08 DIAGNOSIS — G4733 Obstructive sleep apnea (adult) (pediatric): Secondary | ICD-10-CM | POA: Diagnosis not present

## 2017-10-08 DIAGNOSIS — R609 Edema, unspecified: Secondary | ICD-10-CM | POA: Diagnosis not present

## 2017-10-08 DIAGNOSIS — M1990 Unspecified osteoarthritis, unspecified site: Secondary | ICD-10-CM | POA: Diagnosis not present

## 2017-10-15 DIAGNOSIS — G4733 Obstructive sleep apnea (adult) (pediatric): Secondary | ICD-10-CM | POA: Diagnosis not present

## 2017-10-21 DIAGNOSIS — G4733 Obstructive sleep apnea (adult) (pediatric): Secondary | ICD-10-CM | POA: Diagnosis not present

## 2017-11-17 DIAGNOSIS — R300 Dysuria: Secondary | ICD-10-CM | POA: Diagnosis not present

## 2017-11-17 DIAGNOSIS — E782 Mixed hyperlipidemia: Secondary | ICD-10-CM | POA: Diagnosis not present

## 2017-11-17 DIAGNOSIS — E1169 Type 2 diabetes mellitus with other specified complication: Secondary | ICD-10-CM | POA: Diagnosis not present

## 2017-11-24 DIAGNOSIS — E1169 Type 2 diabetes mellitus with other specified complication: Secondary | ICD-10-CM | POA: Diagnosis not present

## 2017-11-24 DIAGNOSIS — E782 Mixed hyperlipidemia: Secondary | ICD-10-CM | POA: Diagnosis not present

## 2017-11-24 DIAGNOSIS — Z9989 Dependence on other enabling machines and devices: Secondary | ICD-10-CM | POA: Diagnosis not present

## 2017-11-24 DIAGNOSIS — Z0000 Encounter for general adult medical examination without abnormal findings: Secondary | ICD-10-CM | POA: Diagnosis not present

## 2017-11-24 DIAGNOSIS — G4733 Obstructive sleep apnea (adult) (pediatric): Secondary | ICD-10-CM | POA: Diagnosis not present

## 2018-01-10 DIAGNOSIS — R350 Frequency of micturition: Secondary | ICD-10-CM | POA: Diagnosis not present

## 2018-01-10 DIAGNOSIS — R300 Dysuria: Secondary | ICD-10-CM | POA: Diagnosis not present

## 2018-01-25 DIAGNOSIS — G4733 Obstructive sleep apnea (adult) (pediatric): Secondary | ICD-10-CM | POA: Diagnosis not present

## 2018-03-25 DIAGNOSIS — G4733 Obstructive sleep apnea (adult) (pediatric): Secondary | ICD-10-CM | POA: Diagnosis not present

## 2018-03-31 DIAGNOSIS — R300 Dysuria: Secondary | ICD-10-CM | POA: Diagnosis not present

## 2018-05-17 DIAGNOSIS — R300 Dysuria: Secondary | ICD-10-CM | POA: Diagnosis not present

## 2018-05-30 DIAGNOSIS — E1169 Type 2 diabetes mellitus with other specified complication: Secondary | ICD-10-CM | POA: Diagnosis not present

## 2018-05-30 DIAGNOSIS — E782 Mixed hyperlipidemia: Secondary | ICD-10-CM | POA: Diagnosis not present

## 2018-06-06 DIAGNOSIS — E1169 Type 2 diabetes mellitus with other specified complication: Secondary | ICD-10-CM | POA: Diagnosis not present

## 2018-06-06 DIAGNOSIS — Z23 Encounter for immunization: Secondary | ICD-10-CM | POA: Diagnosis not present

## 2018-06-06 DIAGNOSIS — E782 Mixed hyperlipidemia: Secondary | ICD-10-CM | POA: Diagnosis not present

## 2018-06-06 DIAGNOSIS — Z0000 Encounter for general adult medical examination without abnormal findings: Secondary | ICD-10-CM | POA: Diagnosis not present

## 2018-06-21 DIAGNOSIS — G4733 Obstructive sleep apnea (adult) (pediatric): Secondary | ICD-10-CM | POA: Diagnosis not present
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
## 2018-10-04 DIAGNOSIS — G4733 Obstructive sleep apnea (adult) (pediatric): Secondary | ICD-10-CM | POA: Diagnosis not present

## 2018-11-28 DIAGNOSIS — E782 Mixed hyperlipidemia: Secondary | ICD-10-CM | POA: Diagnosis not present

## 2018-11-28 DIAGNOSIS — E1169 Type 2 diabetes mellitus with other specified complication: Secondary | ICD-10-CM | POA: Diagnosis not present

## 2018-11-28 DIAGNOSIS — R350 Frequency of micturition: Secondary | ICD-10-CM | POA: Diagnosis not present

## 2018-12-05 DIAGNOSIS — G4733 Obstructive sleep apnea (adult) (pediatric): Secondary | ICD-10-CM | POA: Diagnosis not present

## 2018-12-05 DIAGNOSIS — M791 Myalgia, unspecified site: Secondary | ICD-10-CM | POA: Diagnosis not present

## 2018-12-05 DIAGNOSIS — M5136 Other intervertebral disc degeneration, lumbar region: Secondary | ICD-10-CM | POA: Diagnosis not present

## 2018-12-05 DIAGNOSIS — Z0000 Encounter for general adult medical examination without abnormal findings: Secondary | ICD-10-CM | POA: Diagnosis not present

## 2018-12-05 DIAGNOSIS — E782 Mixed hyperlipidemia: Secondary | ICD-10-CM | POA: Diagnosis not present

## 2018-12-05 DIAGNOSIS — E1169 Type 2 diabetes mellitus with other specified complication: Secondary | ICD-10-CM | POA: Diagnosis not present

## 2018-12-05 DIAGNOSIS — Z9989 Dependence on other enabling machines and devices: Secondary | ICD-10-CM | POA: Diagnosis not present

## 2019-01-06 DIAGNOSIS — G4733 Obstructive sleep apnea (adult) (pediatric): Secondary | ICD-10-CM | POA: Diagnosis not present

## 2019-02-10 DIAGNOSIS — R300 Dysuria: Secondary | ICD-10-CM | POA: Diagnosis not present

## 2019-02-20 DIAGNOSIS — E782 Mixed hyperlipidemia: Secondary | ICD-10-CM | POA: Diagnosis not present

## 2019-02-20 DIAGNOSIS — Z6835 Body mass index (BMI) 35.0-35.9, adult: Secondary | ICD-10-CM | POA: Diagnosis not present

## 2019-02-20 DIAGNOSIS — E6609 Other obesity due to excess calories: Secondary | ICD-10-CM | POA: Diagnosis not present

## 2019-02-20 DIAGNOSIS — M5136 Other intervertebral disc degeneration, lumbar region: Secondary | ICD-10-CM | POA: Diagnosis not present

## 2019-02-20 DIAGNOSIS — Z9989 Dependence on other enabling machines and devices: Secondary | ICD-10-CM | POA: Diagnosis not present

## 2019-02-20 DIAGNOSIS — I10 Essential (primary) hypertension: Secondary | ICD-10-CM | POA: Diagnosis not present

## 2019-02-20 DIAGNOSIS — E1169 Type 2 diabetes mellitus with other specified complication: Secondary | ICD-10-CM | POA: Diagnosis not present

## 2019-02-20 DIAGNOSIS — G4733 Obstructive sleep apnea (adult) (pediatric): Secondary | ICD-10-CM | POA: Diagnosis not present

## 2019-02-20 DIAGNOSIS — F419 Anxiety disorder, unspecified: Secondary | ICD-10-CM | POA: Diagnosis not present

## 2019-02-28 DIAGNOSIS — R300 Dysuria: Secondary | ICD-10-CM | POA: Diagnosis not present

## 2019-03-10 DIAGNOSIS — N8111 Cystocele, midline: Secondary | ICD-10-CM | POA: Diagnosis not present

## 2019-03-10 DIAGNOSIS — N904 Leukoplakia of vulva: Secondary | ICD-10-CM | POA: Diagnosis not present

## 2019-03-10 DIAGNOSIS — N816 Rectocele: Secondary | ICD-10-CM | POA: Diagnosis not present

## 2019-03-10 DIAGNOSIS — N3281 Overactive bladder: Secondary | ICD-10-CM | POA: Diagnosis not present

## 2019-04-28 DIAGNOSIS — N200 Calculus of kidney: Secondary | ICD-10-CM | POA: Diagnosis not present

## 2019-04-28 DIAGNOSIS — R300 Dysuria: Secondary | ICD-10-CM | POA: Diagnosis not present

## 2019-04-28 DIAGNOSIS — N390 Urinary tract infection, site not specified: Secondary | ICD-10-CM | POA: Diagnosis not present

## 2019-05-09 DIAGNOSIS — G4733 Obstructive sleep apnea (adult) (pediatric): Secondary | ICD-10-CM | POA: Diagnosis not present

## 2019-06-02 DIAGNOSIS — E782 Mixed hyperlipidemia: Secondary | ICD-10-CM | POA: Diagnosis not present

## 2019-06-02 DIAGNOSIS — E1169 Type 2 diabetes mellitus with other specified complication: Secondary | ICD-10-CM | POA: Diagnosis not present

## 2019-06-09 DIAGNOSIS — Z23 Encounter for immunization: Secondary | ICD-10-CM | POA: Diagnosis not present

## 2019-06-09 DIAGNOSIS — G4733 Obstructive sleep apnea (adult) (pediatric): Secondary | ICD-10-CM | POA: Diagnosis not present

## 2019-06-09 DIAGNOSIS — Z0000 Encounter for general adult medical examination without abnormal findings: Secondary | ICD-10-CM | POA: Diagnosis not present

## 2019-06-09 DIAGNOSIS — E1169 Type 2 diabetes mellitus with other specified complication: Secondary | ICD-10-CM | POA: Diagnosis not present

## 2019-06-09 DIAGNOSIS — E782 Mixed hyperlipidemia: Secondary | ICD-10-CM | POA: Diagnosis not present

## 2019-06-09 DIAGNOSIS — Z9989 Dependence on other enabling machines and devices: Secondary | ICD-10-CM | POA: Diagnosis not present

## 2019-06-09 DIAGNOSIS — B349 Viral infection, unspecified: Secondary | ICD-10-CM | POA: Diagnosis not present

## 2019-08-10 DIAGNOSIS — G4733 Obstructive sleep apnea (adult) (pediatric): Secondary | ICD-10-CM | POA: Diagnosis not present

## 2019-11-20 DIAGNOSIS — E119 Type 2 diabetes mellitus without complications: Secondary | ICD-10-CM | POA: Diagnosis not present

## 2019-11-21 DIAGNOSIS — G4733 Obstructive sleep apnea (adult) (pediatric): Secondary | ICD-10-CM | POA: Diagnosis not present

## 2019-11-30 DIAGNOSIS — E1169 Type 2 diabetes mellitus with other specified complication: Secondary | ICD-10-CM | POA: Diagnosis not present

## 2019-11-30 DIAGNOSIS — Z0000 Encounter for general adult medical examination without abnormal findings: Secondary | ICD-10-CM | POA: Diagnosis not present

## 2019-11-30 DIAGNOSIS — E782 Mixed hyperlipidemia: Secondary | ICD-10-CM | POA: Diagnosis not present

## 2019-12-07 DIAGNOSIS — E782 Mixed hyperlipidemia: Secondary | ICD-10-CM | POA: Diagnosis not present

## 2019-12-07 DIAGNOSIS — Z0000 Encounter for general adult medical examination without abnormal findings: Secondary | ICD-10-CM | POA: Diagnosis not present

## 2019-12-07 DIAGNOSIS — E1165 Type 2 diabetes mellitus with hyperglycemia: Secondary | ICD-10-CM | POA: Diagnosis not present

## 2020-01-22 DIAGNOSIS — R300 Dysuria: Secondary | ICD-10-CM | POA: Diagnosis not present

## 2020-02-15 DIAGNOSIS — Z6835 Body mass index (BMI) 35.0-35.9, adult: Secondary | ICD-10-CM | POA: Diagnosis not present

## 2020-02-15 DIAGNOSIS — E782 Mixed hyperlipidemia: Secondary | ICD-10-CM | POA: Diagnosis not present

## 2020-02-15 DIAGNOSIS — E1169 Type 2 diabetes mellitus with other specified complication: Secondary | ICD-10-CM | POA: Diagnosis not present

## 2020-02-15 DIAGNOSIS — I10 Essential (primary) hypertension: Secondary | ICD-10-CM | POA: Diagnosis not present

## 2020-02-15 DIAGNOSIS — E6609 Other obesity due to excess calories: Secondary | ICD-10-CM | POA: Diagnosis not present

## 2020-02-15 DIAGNOSIS — Z9989 Dependence on other enabling machines and devices: Secondary | ICD-10-CM | POA: Diagnosis not present

## 2020-02-15 DIAGNOSIS — G4733 Obstructive sleep apnea (adult) (pediatric): Secondary | ICD-10-CM | POA: Diagnosis not present

## 2020-03-07 DIAGNOSIS — R399 Unspecified symptoms and signs involving the genitourinary system: Secondary | ICD-10-CM | POA: Diagnosis not present

## 2020-03-07 DIAGNOSIS — E1165 Type 2 diabetes mellitus with hyperglycemia: Secondary | ICD-10-CM | POA: Diagnosis not present

## 2020-04-19 DIAGNOSIS — G4733 Obstructive sleep apnea (adult) (pediatric): Secondary | ICD-10-CM | POA: Diagnosis not present

## 2020-06-03 DIAGNOSIS — E1169 Type 2 diabetes mellitus with other specified complication: Secondary | ICD-10-CM | POA: Diagnosis not present

## 2020-06-03 DIAGNOSIS — G4733 Obstructive sleep apnea (adult) (pediatric): Secondary | ICD-10-CM | POA: Diagnosis not present

## 2020-06-03 DIAGNOSIS — N3090 Cystitis, unspecified without hematuria: Secondary | ICD-10-CM | POA: Diagnosis not present

## 2020-06-03 DIAGNOSIS — Z9989 Dependence on other enabling machines and devices: Secondary | ICD-10-CM | POA: Diagnosis not present

## 2020-06-03 DIAGNOSIS — Z23 Encounter for immunization: Secondary | ICD-10-CM | POA: Diagnosis not present

## 2020-06-03 DIAGNOSIS — Z780 Asymptomatic menopausal state: Secondary | ICD-10-CM | POA: Diagnosis not present

## 2020-06-03 DIAGNOSIS — E782 Mixed hyperlipidemia: Secondary | ICD-10-CM | POA: Diagnosis not present

## 2020-06-03 DIAGNOSIS — Z0000 Encounter for general adult medical examination without abnormal findings: Secondary | ICD-10-CM | POA: Diagnosis not present

## 2020-06-12 DIAGNOSIS — M8588 Other specified disorders of bone density and structure, other site: Secondary | ICD-10-CM | POA: Diagnosis not present

## 2020-08-21 DIAGNOSIS — G4733 Obstructive sleep apnea (adult) (pediatric): Secondary | ICD-10-CM | POA: Diagnosis not present

## 2020-09-23 ENCOUNTER — Ambulatory Visit (INDEPENDENT_AMBULATORY_CARE_PROVIDER_SITE_OTHER): Payer: Medicare HMO | Admitting: Urology

## 2020-09-23 ENCOUNTER — Other Ambulatory Visit: Payer: Self-pay

## 2020-09-23 ENCOUNTER — Encounter: Payer: Self-pay | Admitting: Urology

## 2020-09-23 VITALS — BP 168/64 | HR 111 | Wt 186.0 lb

## 2020-09-23 DIAGNOSIS — N390 Urinary tract infection, site not specified: Secondary | ICD-10-CM

## 2020-09-23 DIAGNOSIS — R351 Nocturia: Secondary | ICD-10-CM

## 2020-09-23 LAB — BLADDER SCAN AMB NON-IMAGING

## 2020-09-23 MED ORDER — ESTRADIOL 0.1 MG/GM VA CREA: TOPICAL_CREAM | VAGINAL | 12 refills | Status: DC

## 2020-09-23 NOTE — Patient Instructions (Signed)
Estrogen Cream Instruction  Discard applicator  Apply pea sized amount to tip of finger to urethra before bed. Wash hands well after application. Use Monday, Wednesday and Friday  

## 2020-09-23 NOTE — Progress Notes (Signed)
09/23/20 12:59 PM   Jenny Pierce 1929/12/13 [id number]  CC: recurrent UTIs, nocturia  HPI: I saw Jenny Pierce and her daughter in clinic today for evaluation of recurrent UTIs.  She reports increased urinary infections over the last 2 to 3 years.  Her symptoms with UTI typically consist of dysuria and urgency and frequency.  She denies any incontinence.  Her primary complaint is nocturia 2-3 times at night.  She will drink coffee in the afternoon.  She previously was followed by a urogynecologist at [facility] and was last seen by her in October 2020, per that note she did notice some improvement on Myrbetriq with her overactive symptoms.  I reviewed these outside notes, as well as from her PCP.  Currently though she is only urinating 3 times per day.  She denies any gross hematuria or history of stones.  She has sleep apnea but is compliant with her CPAP machine.  Urinalysis today is pending.  She was recently treated for UTI with antibiotics, and denies any UTI symptoms today.  PVR is normal at 49 mL.  There is no prior imaging to review.    PMH: Past Medical History:  Diagnosis Date  . Anxiety   . Arthritis   . Diabetes mellitus without complication (HCC)   . Hypertension   . Sleep apnea     Surgical History: Past Surgical History:  Procedure Laterality Date  . APPENDECTOMY    . CATARACT EXTRACTION, BILATERAL    . REPLACEMENT TOTAL KNEE Bilateral   . TONSILLECTOMY AND ADENOIDECTOMY    . TUBAL LIGATION      Family History: Family History  Problem Relation Age of Onset  . Diabetes Father   . Diabetes Sister   . Diabetes Brother   . Diabetes Daughter   . Diabetes Sister     Social History:  reports that she has never smoked. She has never used smokeless tobacco. She reports that she does not drink alcohol and does not use drugs.  Physical Exam: BP (!) 168/64   Pulse (!) 111   Wt 186 lb (84.4 kg)   BMI 31.93 kg/m    Constitutional:  Alert and oriented, No acute  distress. Cardiovascular: No clubbing, cyanosis, or edema. Respiratory: Normal respiratory effort, no increased work of breathing. GI: Abdomen is soft, nontender, nondistended, no abdominal masses  Laboratory Data: Reviewed, see HPI Culture data reviewed in care everywhere  Pertinent Imaging: None to review  Assessment & Plan:   87 year old female with recurrent culture documented UTIs over the last 2 to 3 years with symptoms of dysuria, urgency, and frequency.  She is not having any significant OAB symptoms during the day or incontinence, but has nocturia 2-3 times at night which is really her chief issue aside from the recurrent infections.  We discussed the relationship between sleep apnea and nocturia.  Behavioral strategies discussed regarding nocturia including minimizing caffeine and fluids 3 to 4 hours before bed, double voiding prior to bedtime, and consistency with CPAP machine.  We discussed the evaluation and treatment of patients with recurrent UTIs at length.  We specifically discussed the differences between asymptomatic bacteriuria and true urinary tract infection.  We discussed the AUA definition of recurrent UTI of at least 2 culture proven symptomatic acute cystitis episodes in a 109-month period, or 3 within a 1 year period.  We discussed the importance of culture directed antibiotic treatment, and antibiotic stewardship.  First-line therapy includes nitrofurantoin(5 days), Bactrim(3 days), or fosfomycin(3 g single dose).  Possible etiologies of recurrent infection include periurethral tissue atrophy in postmenopausal woman, constipation, sexual activity, incomplete emptying, anatomic abnormalities, and even genetic predisposition.  Finally, we discussed the role of perineal hygiene, timed voiding, adequate hydration, topical vaginal estrogen, cranberry prophylaxis, and low-dose antibiotic prophylaxis.  Start topical estrogen cream and cranberry prophylaxis Consider methenamine in  the future if recurrent infections Renal ultrasound to evaluate for stones or hydronephrosis, call with results Consider Myrbetriq in the future if OAB symptoms, or worsening nocturia RTC 3 months with PA symptom check  Legrand Rams, MD 09/23/2020  [facility] [address], [address] [place], [place] [address] [phone]

## 2020-09-24 ENCOUNTER — Telehealth: Payer: Self-pay

## 2020-09-24 LAB — URINALYSIS, COMPLETE
Bilirubin, UA: NEGATIVE
Glucose, UA: NEGATIVE
Ketones, UA: NEGATIVE
Nitrite, UA: NEGATIVE
Protein,UA: NEGATIVE
Specific Gravity, UA: 1.02 (ref 1.005–1.030)
Urobilinogen, Ur: 0.2 mg/dL (ref 0.2–1.0)
pH, UA: 7 (ref 5.0–7.5)

## 2020-09-24 LAB — MICROSCOPIC EXAMINATION

## 2020-09-25 ENCOUNTER — Other Ambulatory Visit: Payer: Self-pay

## 2020-09-25 DIAGNOSIS — N390 Urinary tract infection, site not specified: Secondary | ICD-10-CM

## 2020-09-25 MED ORDER — PREMARIN 0.625 MG/GM VA CREA: TOPICAL_CREAM | VAGINAL | 12 refills | Status: DC

## 2020-09-25 MED ORDER — ESTRADIOL 0.1 MG/GM VA CREA: TOPICAL_CREAM | VAGINAL | 12 refills | Status: DC

## 2020-09-25 NOTE — Telephone Encounter (Signed)
Incoming message from pt's pharmacy stating that RX for estradiol cream is $90, will try another brand to see if preferred, otherwise pt will need to call insurance company and see what is preferred on her plan.

## 2020-09-25 NOTE — Telephone Encounter (Signed)
Compounded medication faxed to [facility]

## 2020-10-11 ENCOUNTER — Other Ambulatory Visit: Payer: Self-pay

## 2020-10-11 ENCOUNTER — Ambulatory Visit
Admission: RE | Admit: 2020-10-11 | Discharge: 2020-10-11 | Disposition: A | Discharge status: Home / Self Care | Payer: Medicare HMO | Source: Ambulatory Visit | Attending: Urology | Admitting: Urology

## 2020-10-11 DIAGNOSIS — N390 Urinary tract infection, site not specified: Secondary | ICD-10-CM | POA: Diagnosis not present

## 2020-10-11 DIAGNOSIS — N281 Cyst of kidney, acquired: Secondary | ICD-10-CM | POA: Diagnosis not present

## 2020-10-16 ENCOUNTER — Telehealth: Payer: Self-pay

## 2020-10-16 NOTE — Telephone Encounter (Signed)
-----   Message from Sondra Come, MD sent at 10/16/2020  9:05 AM EDT ----- Renal ultrasound normal, keep follow-up as scheduled  Legrand Rams, MD 10/16/2020

## 2020-10-16 NOTE — Telephone Encounter (Signed)
Called pt informed her of the information below. Pt gave verbal understanding.  

## 2020-12-02 DIAGNOSIS — E782 Mixed hyperlipidemia: Secondary | ICD-10-CM | POA: Diagnosis not present

## 2020-12-02 DIAGNOSIS — E1165 Type 2 diabetes mellitus with hyperglycemia: Secondary | ICD-10-CM | POA: Diagnosis not present

## 2020-12-03 DIAGNOSIS — E1165 Type 2 diabetes mellitus with hyperglycemia: Secondary | ICD-10-CM | POA: Diagnosis not present

## 2020-12-09 DIAGNOSIS — E782 Mixed hyperlipidemia: Secondary | ICD-10-CM | POA: Diagnosis not present

## 2020-12-09 DIAGNOSIS — E119 Type 2 diabetes mellitus without complications: Secondary | ICD-10-CM | POA: Diagnosis not present

## 2020-12-09 DIAGNOSIS — G4733 Obstructive sleep apnea (adult) (pediatric): Secondary | ICD-10-CM | POA: Diagnosis not present

## 2020-12-09 DIAGNOSIS — Z0000 Encounter for general adult medical examination without abnormal findings: Secondary | ICD-10-CM | POA: Diagnosis not present

## 2020-12-09 DIAGNOSIS — E1169 Type 2 diabetes mellitus with other specified complication: Secondary | ICD-10-CM | POA: Diagnosis not present

## 2020-12-09 DIAGNOSIS — Z9989 Dependence on other enabling machines and devices: Secondary | ICD-10-CM | POA: Diagnosis not present

## 2020-12-16 ENCOUNTER — Other Ambulatory Visit: Payer: Self-pay

## 2020-12-16 ENCOUNTER — Ambulatory Visit: Payer: Medicare HMO | Admitting: Physician Assistant

## 2020-12-16 ENCOUNTER — Encounter: Payer: Self-pay | Admitting: Physician Assistant

## 2020-12-16 VITALS — BP 156/75 | HR 109 | Ht 63.0 in | Wt 175.0 lb

## 2020-12-16 DIAGNOSIS — R351 Nocturia: Secondary | ICD-10-CM

## 2020-12-16 DIAGNOSIS — Z87440 Personal history of urinary (tract) infections: Secondary | ICD-10-CM | POA: Diagnosis not present

## 2020-12-16 DIAGNOSIS — N390 Urinary tract infection, site not specified: Secondary | ICD-10-CM | POA: Diagnosis not present

## 2020-12-16 DIAGNOSIS — R3129 Other microscopic hematuria: Secondary | ICD-10-CM

## 2020-12-16 NOTE — Progress Notes (Signed)
12/16/2020 1:53 PM   Jenny Pierce 1929-08-27 [id number]  CC: Chief Complaint  Patient presents with  . Follow-up    HPI: Jenny Pierce is a 87 y.o. female with PMH diabetes, OSA, recurrent UTI, and nocturia who presents today for symptom recheck on topical vaginal estrogen cream and cranberry supplements.  She was seen in clinic most recently by Dr. Richardo Hanks on 09/23/2020 for evaluation of the above.  UA at that time notable for pyuria and microscopic hematuria, though patient was asymptomatic and no culture was sent.  She subsequently underwent renal ultrasound with no significant findings.  Today she reports having been treated for acute cystitis by Dr. Hyacinth Meeker last week, first with Cipro then switched to another unknown antibiotic with persistent dysuria.  She states she is still burning today and anticipates finishing her second course of antibiotics tomorrow.  Additionally, she reports nocturia every 45 minutes which is been ongoing for months.  She reports she has been using topical vaginal estrogen cream every Monday, Wednesday, and Friday, as instructed by Dr. Richardo Hanks.  She is also taking cranberry supplements as instructed.  She is scheduled to see Dr. Dalbert Garnet today for evaluation of possible cystocele and to discuss pessary placement.  In-office UA today positive for trace ketones, 2+ blood, 1+ protein, and 2+ leukocyte esterase; urine microscopy with 11-30 WBCs/HPF and 11-30 RBCs/HPF.  PMH: Past Medical History:  Diagnosis Date  . Anxiety   . Arthritis   . Diabetes mellitus without complication (HCC)   . Hypertension   . Sleep apnea     Surgical History: Past Surgical History:  Procedure Laterality Date  . APPENDECTOMY    . CATARACT EXTRACTION, BILATERAL    . REPLACEMENT TOTAL KNEE Bilateral   . TONSILLECTOMY AND ADENOIDECTOMY    . TUBAL LIGATION      Home Medications:  Allergies as of 12/16/2020   No Known Allergies     Medication List       Accurate as  of Dec 16, 2020  1:53 PM. If you have any questions, ask your nurse or doctor.        ALPRAZolam 0.25 MG tablet Commonly known as: XANAX Take 0.25 mg by mouth daily as needed.   amLODipine 2.5 MG tablet Commonly known as: NORVASC Take 2.5 mg by mouth 2 (two) times daily.   aspirin EC 81 MG tablet Take 81 mg by mouth daily.   BIOTIN PO Take 1 capsule by mouth daily.   CALCIUM 600 PO Take 1 tablet by mouth 2 (two) times daily.   CINNAMON PO Take 1 capsule by mouth daily.   furosemide 20 MG tablet Commonly known as: LASIX Take 20 mg by mouth daily as needed.   hydrochlorothiazide 12.5 MG capsule Commonly known as: MICROZIDE Take 12.5 mg by mouth daily.   lisinopril 5 MG tablet Commonly known as: ZESTRIL Take 5 mg by mouth daily.   meloxicam 15 MG tablet Commonly known as: MOBIC Take 15 mg by mouth daily.   metFORMIN 500 MG tablet Commonly known as: GLUCOPHAGE Take 1 tablet by mouth daily with breakfast.   metoprolol succinate 25 MG 24 hr tablet Commonly known as: TOPROL-XL Take 12.5 mg by mouth 2 (two) times daily.   MULTIVITAMIN ADULT PO Take 1 tablet by mouth daily.   potassium chloride 10 MEQ tablet Commonly known as: KLOR-CON Take 10 mEq by mouth daily as needed.   Premarin vaginal cream Generic drug: conjugated estrogens Estrogen Cream Instruction Discard applicator Apply pea  sized amount to tip of finger to urethra before bed. Wash hands well after application. Use Monday, Wednesday and Friday   raloxifene 60 MG tablet Commonly known as: EVISTA Take 60 mg by mouth daily.   traMADol 50 MG tablet Commonly known as: ULTRAM Take 50 mg by mouth 2 (two) times daily as needed.   Vitamin D3 50 MCG (2000 UT) capsule Take 2,000 Units by mouth daily.       Allergies:  No Known Allergies  Family History: Family History  Problem Relation Age of Onset  . Diabetes Father   . Diabetes Sister   . Diabetes Brother   . Diabetes Daughter   . Diabetes  Sister   . Prostate cancer Neg Hx   . Kidney cancer Neg Hx   . Bladder Cancer Neg Hx     Social History:   reports that she has never smoked. She has never used smokeless tobacco. She reports that she does not drink alcohol and does not use drugs.  Physical Exam: BP (!) 156/75   Pulse (!) 109   Ht 5\' 3"  (1.6 m)   Wt 175 lb (79.4 kg)   BMI 31.00 kg/m   Constitutional:  Alert and oriented, no acute distress, nontoxic appearing HEENT: [place], AT Cardiovascular: No clubbing, cyanosis, or edema Respiratory: Normal respiratory effort, no increased work of breathing Skin: No rashes, bruises or suspicious lesions Neurologic: Grossly intact, no focal deficits, moving all 4 extremities Psychiatric: Normal mood and affect  Laboratory Data: Results for orders placed or performed in visit on 12/16/20  Mycoplasma / ureaplasma culture   Specimen: Genital   UR  Result Value Ref Range   Ureaplasma urealyticum Comment Negative   Mycoplasma hominis Culture Comment Negative  Microscopic Examination   Urine  Result Value Ref Range   WBC, UA 11-30 (A) 0 - 5 /hpf   RBC 11-30 (A) 0 - 2 /hpf   Epithelial Cells (non renal) 0-10 0 - 10 /hpf   Renal Epithel, UA 0-10 (A) None seen /hpf   Casts Present (A) None seen /lpf   Cast Type Hyaline casts N/A   Bacteria, UA None seen None seen/Few  Urinalysis, Complete  Result Value Ref Range   Specific Gravity, UA 1.020 1.005 - 1.030   pH, UA 6.0 5.0 - 7.5   Color, UA Yellow Yellow   Appearance Ur Cloudy (A) Clear   Leukocytes,UA 2+ (A) Negative   Protein,UA 1+ (A) Negative/Trace   Glucose, UA Negative Negative   Ketones, UA Trace (A) Negative   RBC, UA 2+ (A) Negative   Bilirubin, UA Negative Negative   Urobilinogen, Ur 0.2 0.2 - 1.0 mg/dL   Nitrite, UA Negative Negative   Microscopic Examination See below:    Assessment & Plan:   1. History of recurrent UTI (urinary tract infection) Currently being treated for acute cystitis, UA today notable for  pyuria and microscopic hematuria and patient reports ongoing symptoms.  Will send for standard and atypical culture today for further urine testing and treat as indicated. - Urinalysis, Complete - CULTURE, URINE COMPREHENSIVE - Mycoplasma / ureaplasma culture  2. Nocturia Stable, per patient report.  Will consider OAB medication in the future once we clear her current infection.  3. Microscopic hematuria Unclear if associated with #1 above.  We will plan to repeat UA once she is completed culture appropriate antibiotics to prove resolution of microscopic hematuria.  If cultures are negative, recommend hematuria work-up to rule out underlying pathology.  Return  for Will call with results.  Carman Ching, PA-C  [facility] [address], [address] [place], [place] [address] [phone]

## 2020-12-16 NOTE — Patient Instructions (Signed)
Continue your cranberry supplements and estrogen cream. I'll call you with your urine culture results when I receive them and we'll discuss the plan from there.

## 2020-12-17 LAB — MICROSCOPIC EXAMINATION: Bacteria, UA: NONE SEEN

## 2020-12-17 LAB — URINALYSIS, COMPLETE
Bilirubin, UA: NEGATIVE
Glucose, UA: NEGATIVE
Nitrite, UA: NEGATIVE
Specific Gravity, UA: 1.02 (ref 1.005–1.030)
Urobilinogen, Ur: 0.2 mg/dL (ref 0.2–1.0)
pH, UA: 6 (ref 5.0–7.5)

## 2020-12-19 LAB — CULTURE, URINE COMPREHENSIVE

## 2020-12-23 LAB — MYCOPLASMA / UREAPLASMA CULTURE
Mycoplasma hominis Culture: NEGATIVE
Ureaplasma urealyticum: NEGATIVE

## 2020-12-24 NOTE — Progress Notes (Signed)
Notified patient as instructed, patient pleased. Discussed follow-up appointments, patient agrees. Patient will come back in to get a cath ua.

## 2021-01-08 ENCOUNTER — Ambulatory Visit (INDEPENDENT_AMBULATORY_CARE_PROVIDER_SITE_OTHER): Payer: Medicare HMO | Admitting: Physician Assistant

## 2021-01-08 ENCOUNTER — Other Ambulatory Visit: Payer: Self-pay

## 2021-01-08 VITALS — BP 121/62 | HR 103 | Ht 63.0 in | Wt 176.0 lb

## 2021-01-08 DIAGNOSIS — Z87440 Personal history of urinary (tract) infections: Secondary | ICD-10-CM | POA: Diagnosis not present

## 2021-01-08 DIAGNOSIS — R3129 Other microscopic hematuria: Secondary | ICD-10-CM | POA: Diagnosis not present

## 2021-01-08 NOTE — Progress Notes (Signed)
In and Out Catheterization  Patient is present today for a I & O catheterization due to microscopic hematuria. Patient was cleaned and prepped in a sterile fashion with betadine . A 14FR cath was inserted no complications were noted , 54ml of urine return was noted, urine was amber in color. A clean urine sample was collected for urinalysis. Bladder was drained  And catheter was removed with out difficulty.    Performed by: Franchot Erichsen CMA

## 2021-01-08 NOTE — Progress Notes (Signed)
01/08/2021 4:27 PM   Jenny Pierce 24-Apr-1930 [id number]  CC: Chief Complaint  Patient presents with   Hematuria    HPI: Jenny Pierce is a 87 y.o. female with PMH diabetes, OSA, recurrent UTI, and nocturia who presents today for cath UA.    I saw her in clinic on 12/16/2020 with concerns for ongoing dysuria despite 2 courses of antibiotics prescribed by her PCP.  UA was notable for pyuria and microscopic hematuria that day, and her standard and atypical urine cultures all resulted negative.  Today she reports her dysuria has since resolved.  She does note intermittent discomfort in the right low flank over the past several months.  She states this does not feel like standard back pain and she has wondered if it is associated with her kidney.  She denies a history of nephrolithiasis.  She denies fever, chills, nausea, and vomiting.  She underwent renal ultrasound per Dr. Richardo Hanks on 10/12/2020, with no significant findings.  In-office catheterized UA today positive for trace ketones, 2+ blood, 1+ protein, and trace leukocyte esterase; urine microscopy with 6-10 WBCs/HPF, 11-30 RBCs/HPF, and both hyaline and granular casts.  PMH: Past Medical History:  Diagnosis Date   Anxiety    Arthritis    Diabetes mellitus without complication (HCC)    Hypertension    Sleep apnea     Surgical History: Past Surgical History:  Procedure Laterality Date   APPENDECTOMY     CATARACT EXTRACTION, BILATERAL     REPLACEMENT TOTAL KNEE Bilateral    TONSILLECTOMY AND ADENOIDECTOMY     TUBAL LIGATION      Home Medications:  Allergies as of 01/08/2021   No Known Allergies      Medication List        Accurate as of January 08, 2021  4:27 PM. If you have any questions, ask your nurse or doctor.          ALPRAZolam 0.25 MG tablet Commonly known as: XANAX Take 0.25 mg by mouth daily as needed.   amLODipine 2.5 MG tablet Commonly known as: NORVASC Take 2.5 mg by mouth 2 (two) times  daily.   aspirin EC 81 MG tablet Take 81 mg by mouth daily.   BIOTIN PO Take 1 capsule by mouth daily.   CALCIUM 600 PO Take 1 tablet by mouth 2 (two) times daily.   CINNAMON PO Take 1 capsule by mouth daily.   furosemide 20 MG tablet Commonly known as: LASIX Take 20 mg by mouth daily as needed.   hydrochlorothiazide 12.5 MG capsule Commonly known as: MICROZIDE Take 12.5 mg by mouth daily.   lisinopril 5 MG tablet Commonly known as: ZESTRIL Take 5 mg by mouth daily.   meloxicam 15 MG tablet Commonly known as: MOBIC Take 15 mg by mouth daily.   metFORMIN 500 MG tablet Commonly known as: GLUCOPHAGE Take 1 tablet by mouth daily with breakfast.   metoprolol succinate 25 MG 24 hr tablet Commonly known as: TOPROL-XL Take 12.5 mg by mouth 2 (two) times daily.   MULTIVITAMIN ADULT PO Take 1 tablet by mouth daily.   potassium chloride 10 MEQ tablet Commonly known as: KLOR-CON Take 10 mEq by mouth daily as needed.   Premarin vaginal cream Generic drug: conjugated estrogens Estrogen Cream Instruction Discard applicator Apply pea sized amount to tip of finger to urethra before bed. Wash hands well after application. Use Monday, Wednesday and Friday   raloxifene 60 MG tablet Commonly known as: EVISTA Take 60  mg by mouth daily.   traMADol 50 MG tablet Commonly known as: ULTRAM Take 50 mg by mouth 2 (two) times daily as needed.   Vitamin D3 50 MCG (2000 UT) capsule Take 2,000 Units by mouth daily.        Allergies:  No Known Allergies  Family History: Family History  Problem Relation Age of Onset   Diabetes Father    Diabetes Sister    Diabetes Brother    Diabetes Daughter    Diabetes Sister    Prostate cancer Neg Hx    Kidney cancer Neg Hx    Bladder Cancer Neg Hx     Social History:   reports that she has never smoked. She has never used smokeless tobacco. She reports that she does not drink alcohol and does not use drugs.  Physical Exam: BP  121/62   Pulse (!) 103   Ht 5\' 3"  (1.6 m)   Wt 176 lb (79.8 kg)   BMI 31.18 kg/m   Constitutional:  Alert and oriented, no acute distress, nontoxic appearing HEENT: [place], AT Cardiovascular: No clubbing, cyanosis, or edema Respiratory: Normal respiratory effort, no increased work of breathing Skin: No rashes, bruises or suspicious lesions Neurologic: Grossly intact, no focal deficits, moving all 4 extremities Psychiatric: Normal mood and affect  Laboratory Data: Results for orders placed or performed in visit on 01/08/21  Microscopic Examination   Urine  Result Value Ref Range   WBC, UA 6-10 (A) 0 - 5 /hpf   RBC 11-30 (A) 0 - 2 /hpf   Epithelial Cells (non renal) 0-10 0 - 10 /hpf   Casts Present (A) None seen /lpf   Cast Type Hyaline casts N/A   Bacteria, UA None seen None seen/Few  Urinalysis, Complete  Result Value Ref Range   Specific Gravity, UA 1.020 1.005 - 1.030   pH, UA 5.5 5.0 - 7.5   Color, UA Orange Yellow   Appearance Ur Cloudy (A) Clear   Leukocytes,UA Trace (A) Negative   Protein,UA 1+ (A) Negative/Trace   Glucose, UA Negative Negative   Ketones, UA Trace (A) Negative   RBC, UA 2+ (A) Negative   Bilirubin, UA Negative Negative   Urobilinogen, Ur 0.2 0.2 - 1.0 mg/dL   Nitrite, UA Negative Negative   Microscopic Examination See below:    Assessment & Plan:   1. Microscopic hematuria Persistent on cath UA today despite recent negative urine cultures.  In the setting of her right flank discomfort, I recommended that we proceed with hematuria work-up at this time.  Given the ongoing contrast shortage, we will proceed with CT stone study and follow-up cystoscopy and she may require bilateral retrogrades thereafter depending on her results.  Patient is in agreement with this plan.  - Urinalysis, Complete - CT RENAL STONE STUDY; Future  Return in about 4 weeks (around 02/05/2021) for Cysto and CT results with Dr. 02/07/2021.  Richardo Hanks, PA-C  [facility]  [facility] [address] [place], [place] [phone]

## 2021-01-10 LAB — URINALYSIS, COMPLETE
Bilirubin, UA: NEGATIVE
Glucose, UA: NEGATIVE
Nitrite, UA: NEGATIVE
Specific Gravity, UA: 1.02 (ref 1.005–1.030)
Urobilinogen, Ur: 0.2 mg/dL (ref 0.2–1.0)
pH, UA: 5.5 (ref 5.0–7.5)

## 2021-01-10 LAB — MICROSCOPIC EXAMINATION: Bacteria, UA: NONE SEEN

## 2021-01-15 DIAGNOSIS — G4733 Obstructive sleep apnea (adult) (pediatric): Secondary | ICD-10-CM | POA: Diagnosis not present

## 2021-01-31 ENCOUNTER — Other Ambulatory Visit: Payer: Self-pay

## 2021-01-31 ENCOUNTER — Ambulatory Visit
Admission: RE | Admit: 2021-01-31 | Discharge: 2021-01-31 | Disposition: A | Discharge status: Home / Self Care | Payer: Medicare HMO | Source: Ambulatory Visit | Attending: Physician Assistant | Admitting: Physician Assistant

## 2021-01-31 DIAGNOSIS — R319 Hematuria, unspecified: Secondary | ICD-10-CM | POA: Diagnosis not present

## 2021-01-31 DIAGNOSIS — R3129 Other microscopic hematuria: Secondary | ICD-10-CM | POA: Diagnosis not present

## 2021-01-31 DIAGNOSIS — K429 Umbilical hernia without obstruction or gangrene: Secondary | ICD-10-CM | POA: Diagnosis not present

## 2021-01-31 DIAGNOSIS — K5730 Diverticulosis of large intestine without perforation or abscess without bleeding: Secondary | ICD-10-CM | POA: Diagnosis not present

## 2021-01-31 DIAGNOSIS — K862 Cyst of pancreas: Secondary | ICD-10-CM | POA: Diagnosis not present

## 2021-02-05 ENCOUNTER — Ambulatory Visit: Payer: Medicare HMO | Admitting: Urology

## 2021-02-05 ENCOUNTER — Encounter: Payer: Self-pay | Admitting: Urology

## 2021-02-05 ENCOUNTER — Other Ambulatory Visit: Payer: Self-pay

## 2021-02-05 VITALS — BP 154/72 | HR 97 | Ht 65.0 in | Wt 175.0 lb

## 2021-02-05 DIAGNOSIS — R3121 Asymptomatic microscopic hematuria: Secondary | ICD-10-CM | POA: Diagnosis not present

## 2021-02-05 NOTE — Progress Notes (Signed)
Cystoscopy Procedure Note:  Indication: Microscopic hematuria and sterile pyuria  After informed consent and discussion of the procedure and its risks, Jenny Pierce was positioned and prepped in the standard fashion. Cystoscopy was performed with a flexible cystoscope. The urethra, bladder neck and entire bladder was visualized in a standard fashion.  A few subtle patches of erythema throughout the bladder, but no papillary tumors.  The ureteral orifices were visualized in their normal location and orientation.  Cytology sent.  No abnormalities on retroflexion.  Imaging: CT stone protocol with no significant abnormalities.  Small right renal indeterminate lesion, would not recommend further imaging with her age and comorbidities  Findings: Mild patches of erythema, will call with cytology   Legrand Rams, MD 02/05/2021

## 2021-02-07 ENCOUNTER — Telehealth: Payer: Self-pay

## 2021-02-07 LAB — CYTOLOGY - NON PAP

## 2021-02-07 NOTE — Telephone Encounter (Signed)
-----   Message from Sondra Come, MD sent at 02/07/2021 12:56 PM EDT ----- Good news, no worrisome cells on urine sample.  Recommend 69-month follow-up with PA for history of recurrent UTIs  Legrand Rams, MD 02/07/2021

## 2021-02-07 NOTE — Telephone Encounter (Signed)
Patient notified and scheduled 

## 2021-02-17 DIAGNOSIS — N3090 Cystitis, unspecified without hematuria: Secondary | ICD-10-CM | POA: Diagnosis not present

## 2021-02-17 DIAGNOSIS — R634 Abnormal weight loss: Secondary | ICD-10-CM | POA: Diagnosis not present

## 2021-03-11 DIAGNOSIS — N390 Urinary tract infection, site not specified: Secondary | ICD-10-CM | POA: Diagnosis not present

## 2021-04-07 DIAGNOSIS — R300 Dysuria: Secondary | ICD-10-CM | POA: Diagnosis not present

## 2021-06-02 DIAGNOSIS — E1169 Type 2 diabetes mellitus with other specified complication: Secondary | ICD-10-CM | POA: Diagnosis not present

## 2021-06-02 DIAGNOSIS — E782 Mixed hyperlipidemia: Secondary | ICD-10-CM | POA: Diagnosis not present

## 2021-06-03 DIAGNOSIS — H903 Sensorineural hearing loss, bilateral: Secondary | ICD-10-CM | POA: Diagnosis not present

## 2021-06-09 DIAGNOSIS — Z23 Encounter for immunization: Secondary | ICD-10-CM | POA: Diagnosis not present

## 2021-06-09 DIAGNOSIS — E119 Type 2 diabetes mellitus without complications: Secondary | ICD-10-CM | POA: Diagnosis not present

## 2021-06-09 DIAGNOSIS — Z9989 Dependence on other enabling machines and devices: Secondary | ICD-10-CM | POA: Diagnosis not present

## 2021-06-09 DIAGNOSIS — G4733 Obstructive sleep apnea (adult) (pediatric): Secondary | ICD-10-CM | POA: Diagnosis not present

## 2021-06-09 DIAGNOSIS — H903 Sensorineural hearing loss, bilateral: Secondary | ICD-10-CM | POA: Diagnosis not present

## 2021-07-15 DIAGNOSIS — H6123 Impacted cerumen, bilateral: Secondary | ICD-10-CM | POA: Diagnosis not present

## 2021-07-15 DIAGNOSIS — H903 Sensorineural hearing loss, bilateral: Secondary | ICD-10-CM | POA: Diagnosis not present

## 2021-08-14 DIAGNOSIS — E261 Secondary hyperaldosteronism: Secondary | ICD-10-CM | POA: Diagnosis not present

## 2021-08-14 DIAGNOSIS — E669 Obesity, unspecified: Secondary | ICD-10-CM | POA: Diagnosis not present

## 2021-08-14 DIAGNOSIS — F419 Anxiety disorder, unspecified: Secondary | ICD-10-CM | POA: Diagnosis not present

## 2021-08-14 DIAGNOSIS — G4733 Obstructive sleep apnea (adult) (pediatric): Secondary | ICD-10-CM | POA: Diagnosis not present

## 2021-08-14 DIAGNOSIS — E1136 Type 2 diabetes mellitus with diabetic cataract: Secondary | ICD-10-CM | POA: Diagnosis not present

## 2021-08-14 DIAGNOSIS — F320 Major depressive disorder, single episode, mild: Secondary | ICD-10-CM | POA: Diagnosis not present

## 2021-08-14 DIAGNOSIS — E1165 Type 2 diabetes mellitus with hyperglycemia: Secondary | ICD-10-CM | POA: Diagnosis not present

## 2021-08-14 DIAGNOSIS — I509 Heart failure, unspecified: Secondary | ICD-10-CM | POA: Diagnosis not present

## 2021-08-14 DIAGNOSIS — G8929 Other chronic pain: Secondary | ICD-10-CM | POA: Diagnosis not present

## 2021-08-14 DIAGNOSIS — G620 Drug-induced polyneuropathy: Secondary | ICD-10-CM | POA: Diagnosis not present

## 2021-08-14 DIAGNOSIS — I110 Hypertensive heart disease with heart failure: Secondary | ICD-10-CM | POA: Diagnosis not present

## 2021-08-14 DIAGNOSIS — E1142 Type 2 diabetes mellitus with diabetic polyneuropathy: Secondary | ICD-10-CM | POA: Diagnosis not present

## 2021-08-22 DIAGNOSIS — E119 Type 2 diabetes mellitus without complications: Secondary | ICD-10-CM | POA: Diagnosis not present

## 2021-09-01 DIAGNOSIS — I10 Essential (primary) hypertension: Secondary | ICD-10-CM | POA: Diagnosis not present

## 2021-09-01 DIAGNOSIS — Z0000 Encounter for general adult medical examination without abnormal findings: Secondary | ICD-10-CM | POA: Diagnosis not present

## 2021-09-01 DIAGNOSIS — E119 Type 2 diabetes mellitus without complications: Secondary | ICD-10-CM | POA: Diagnosis not present

## 2021-09-01 DIAGNOSIS — E782 Mixed hyperlipidemia: Secondary | ICD-10-CM | POA: Diagnosis not present

## 2021-09-01 DIAGNOSIS — R42 Dizziness and giddiness: Secondary | ICD-10-CM | POA: Diagnosis not present

## 2021-09-01 DIAGNOSIS — R600 Localized edema: Secondary | ICD-10-CM | POA: Diagnosis not present

## 2021-09-01 DIAGNOSIS — G4733 Obstructive sleep apnea (adult) (pediatric): Secondary | ICD-10-CM | POA: Diagnosis not present

## 2021-09-04 DIAGNOSIS — R8279 Other abnormal findings on microbiological examination of urine: Secondary | ICD-10-CM | POA: Diagnosis not present

## 2021-09-04 DIAGNOSIS — N3020 Other chronic cystitis without hematuria: Secondary | ICD-10-CM | POA: Diagnosis not present

## 2021-09-19 ENCOUNTER — Telehealth: Payer: Self-pay | Admitting: Urology

## 2021-09-19 DIAGNOSIS — N390 Urinary tract infection, site not specified: Secondary | ICD-10-CM | POA: Diagnosis not present

## 2021-09-19 DIAGNOSIS — R399 Unspecified symptoms and signs involving the genitourinary system: Secondary | ICD-10-CM | POA: Diagnosis not present

## 2021-09-19 NOTE — Telephone Encounter (Signed)
Pt just called back and said she is going to go to the urgent care.

## 2021-09-19 NOTE — Telephone Encounter (Signed)
Pt is scheduled to see Shannonb 4/11, last saw [facility] July of 2022. Says she is barely urinating and it is hurting really bad and wants to know if she can be seen before 4/11. Please advise.

## 2021-09-24 IMAGING — US US RENAL
1 series · 15 of 25 positions shown · non-contrast
Comparison: None.

CLINICAL DATA: Chronic UTIs

EXAM:
RENAL / URINARY TRACT ULTRASOUND COMPLETE

[Series 1: us renal · 15 of 36 slices shown]
[im 1/36]
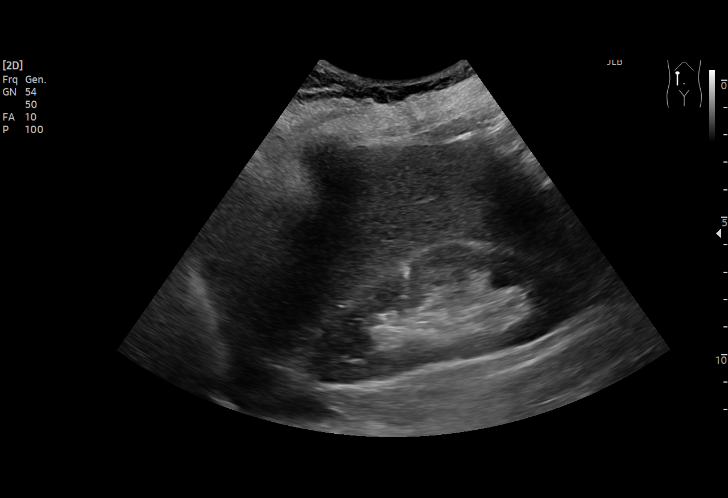
[im 3/36]
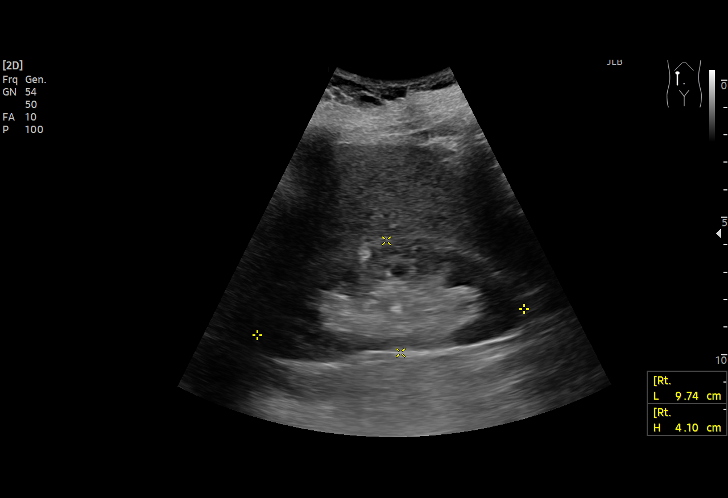
[im 6/36]
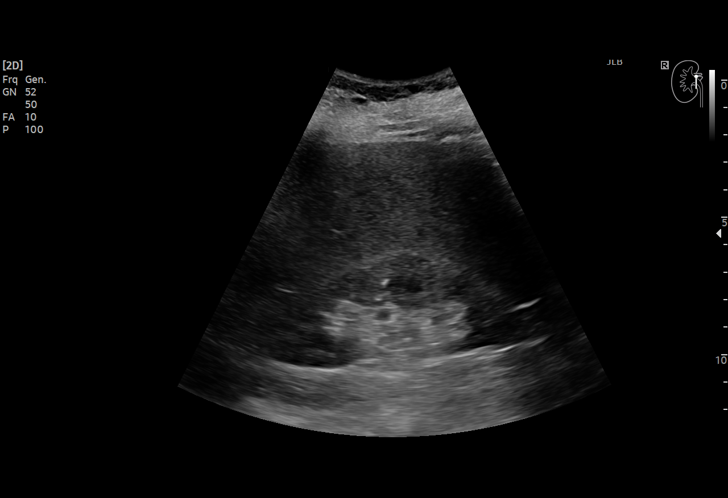
[im 8/36]
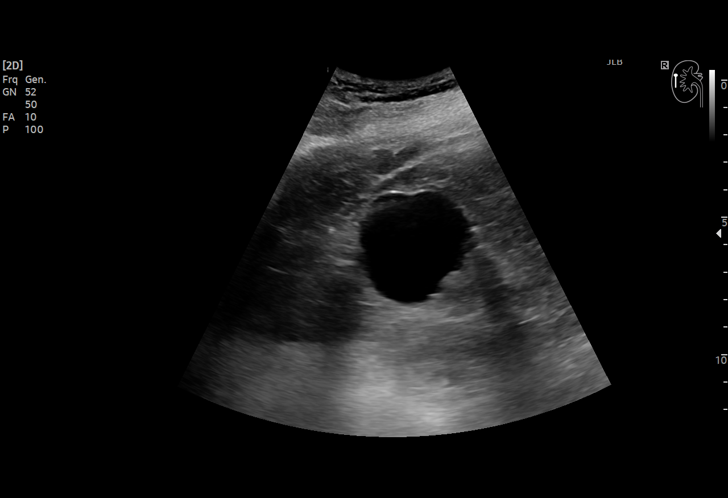
[im 11/36]
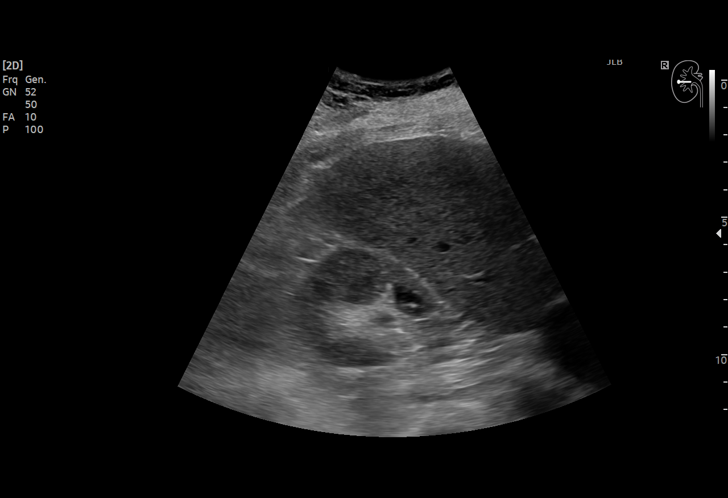
[im 14/36]
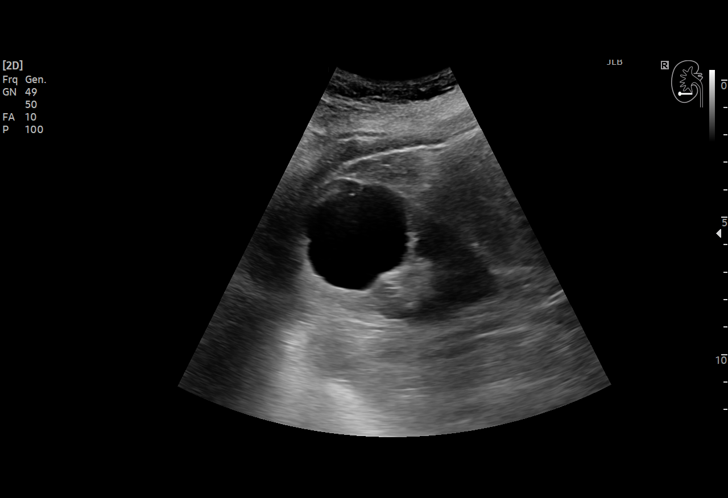
[im 15/36]
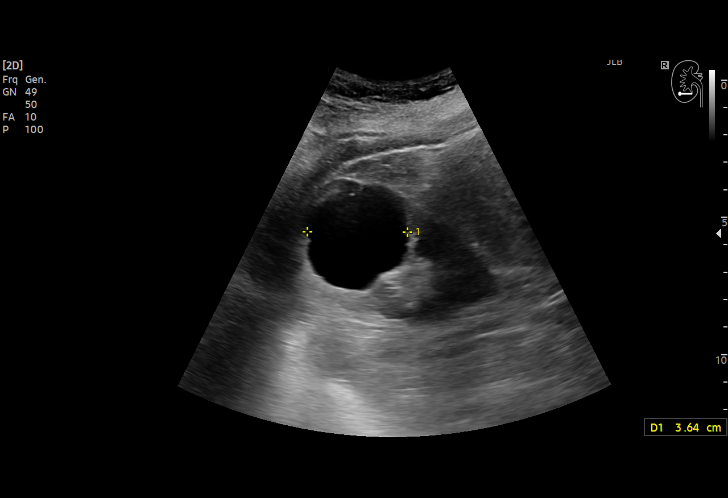
[im 18/36]
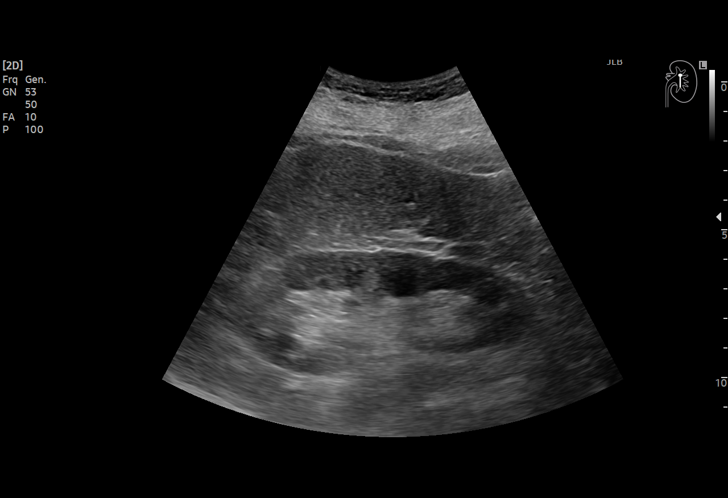
[im 21/36]
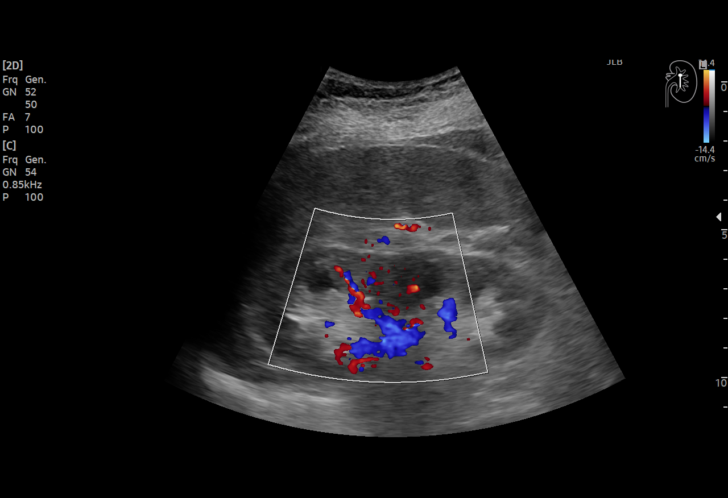
[im 22/36]
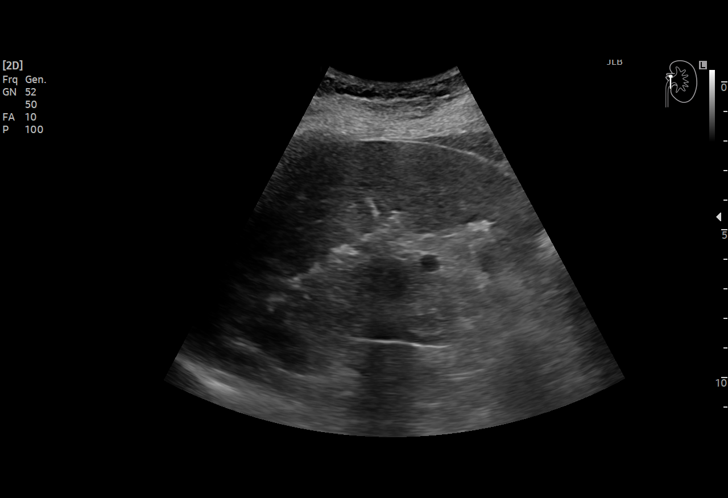
[im 25/36]
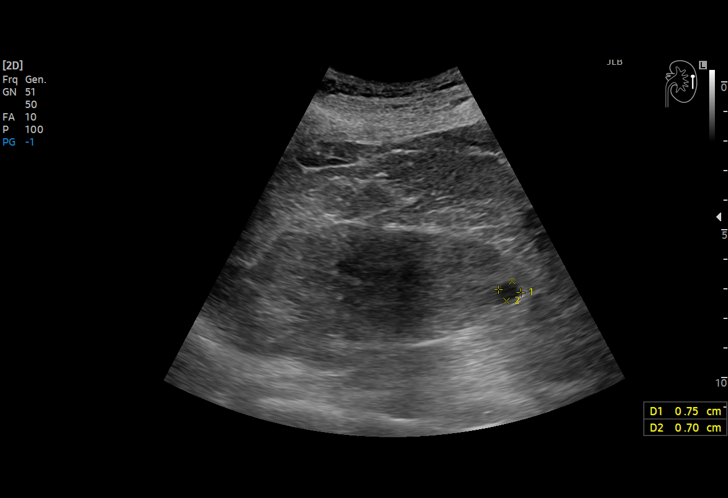
[im 28/36]
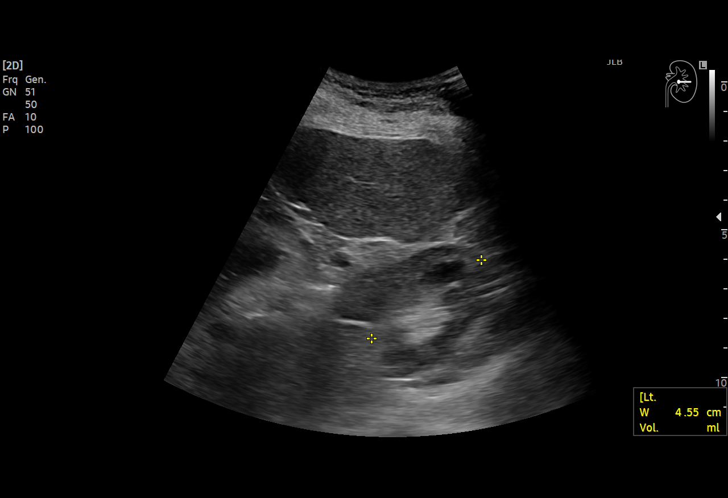
[im 30/36]
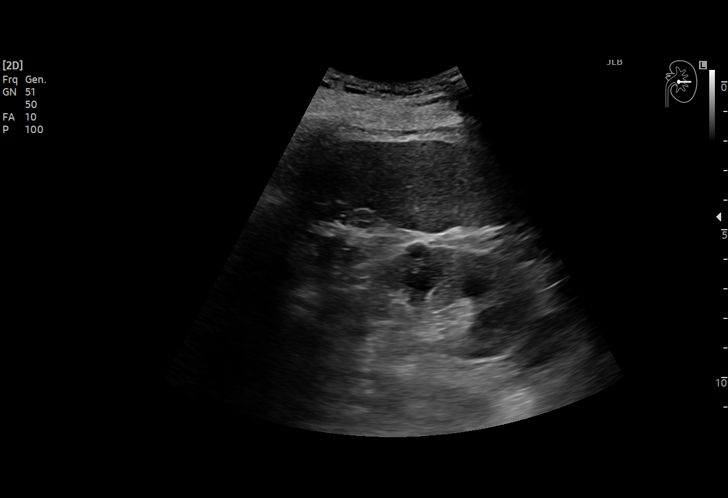
[im 33/36]
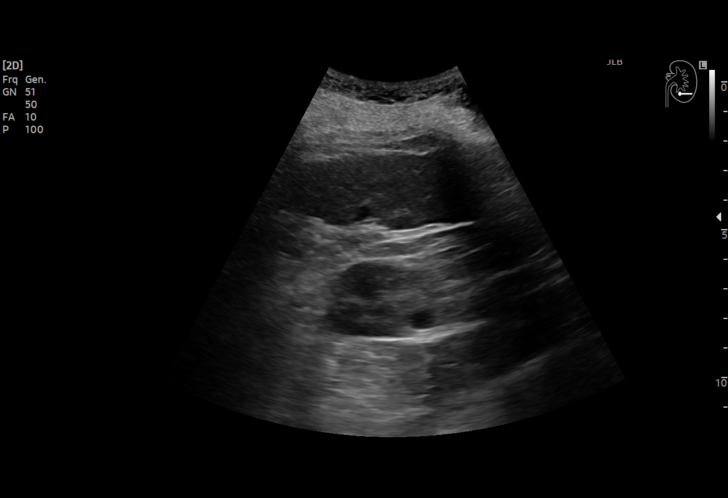
[im 36/36]
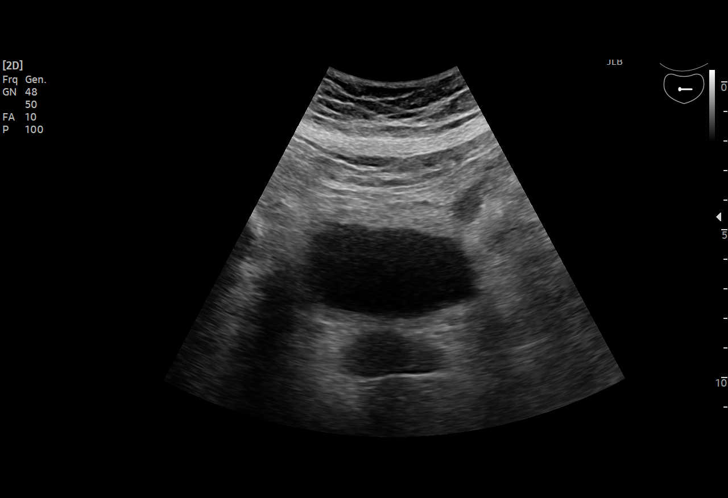

[15 of 25 positions shown; findings below may reference images not displayed]

FINDINGS: Right Kidney:

Renal measurements: 9.7 x 4.1 x 4.4 cm = volume: 91 mL. Echogenicity
within normal limits. No hydronephrosis visualized. There is a cyst
inferiorly measuring up to 4.1 cm.

Left Kidney:

Renal measurements: 10.1 x 3.8 x 4.6 cm = volume: 92 mL.
Echogenicity within normal limits. No hydronephrosis visualized.
There are 2 small cysts measuring up to 0.9 cm.

Bladder:

Decompressed limiting evaluation.  No definite abnormality.

Other:

None.
IMPRESSION: No acute finding in the bilateral kidneys.  Bilateral renal cysts.

## 2021-09-25 DIAGNOSIS — R399 Unspecified symptoms and signs involving the genitourinary system: Secondary | ICD-10-CM | POA: Diagnosis not present

## 2021-09-25 DIAGNOSIS — N390 Urinary tract infection, site not specified: Secondary | ICD-10-CM | POA: Diagnosis not present

## 2021-11-03 NOTE — Progress Notes (Deleted)
? ? ?11/04/2021 ?3:03 PM  ? ?Jenny Pierce ?1930/03/10 ?[id number] ? ?Referring provider: Rusty Aus, MD ?[place] ?[facility] ?[place],  [place] [address] ? ?No chief complaint on file. ? ?Urological history: ?1. High risk hematuria ?-non-smoker ?-CT renal stone study 2022 - non determinant lesion in right kidney - no further work up citing age and co-morbidities ?-RUS 2022 - bilateral renal cysts ?-cysto 2022 - mild patches of erythema ?-urine cytology 2022 - negative  ?-no reports of gross heme ?-UA 10-50 RBC's in 09/2021 - urine culture was negative ? ?2. rUTI ?-Contributing factors of age, vaginal atrophy and diabetes ?-documented positive urine cultures over the last year ? 04/07/2021 MDRO E.coli ? 02/17/2021 E.coli ?-Managed with vaginal estrogen cream and cranberry tablets ? ?3. Nocturia ?-Risk factors for nocturia: obstructive sleep apnea (sleeps with CPAP), hypertension, diabetes, arthritis and anxiety ?-PVR *** ? ?HPI: ?Jenny Pierce is a 88 y.o. female who presents today for follow up. ? ?PVR *** ?   ? ?PMH: ?Past Medical History:  ?Diagnosis Date  ? Anxiety   ? Arthritis   ? Diabetes mellitus without complication ([place])   ? Hypertension   ? Sleep apnea   ? ? ?Surgical History: ?Past Surgical History:  ?Procedure Laterality Date  ? APPENDECTOMY    ? CATARACT EXTRACTION, BILATERAL    ? REPLACEMENT TOTAL KNEE Bilateral   ? TONSILLECTOMY AND ADENOIDECTOMY    ? TUBAL LIGATION    ? ? ?Home Medications:  ?Allergies as of 11/04/2021   ?No Known Allergies ?  ? ?  ?Medication List  ?  ? ?  ? Accurate as of November 03, 2021  3:03 PM. If you have any questions, ask your nurse or doctor.  ?  ?  ? ?  ? ?ALPRAZolam 0.25 MG tablet ?Commonly known as: Duanne Moron ?Take 0.25 mg by mouth daily as needed. ?  ?amLODipine 2.5 MG tablet ?Commonly known as: NORVASC ?Take 2.5 mg by mouth 2 (two) times daily. ?  ?aspirin EC 81 MG tablet ?Take 81 mg by mouth daily. ?  ?BIOTIN PO ?Take 1 capsule by mouth  daily. ?  ?CALCIUM 600 PO ?Take 1 tablet by mouth 2 (two) times daily. ?  ?CINNAMON PO ?Take 1 capsule by mouth daily. ?  ?hydrochlorothiazide 12.5 MG capsule ?Commonly known as: MICROZIDE ?Take 12.5 mg by mouth daily. ?  ?lisinopril 5 MG tablet ?Commonly known as: ZESTRIL ?Take 5 mg by mouth daily. ?  ?meloxicam 15 MG tablet ?Commonly known as: MOBIC ?Take 15 mg by mouth daily. ?  ?metFORMIN 500 MG tablet ?Commonly known as: GLUCOPHAGE ?Take 1 tablet by mouth daily with breakfast. ?  ?metoprolol succinate 25 MG 24 hr tablet ?Commonly known as: TOPROL-XL ?Take 12.5 mg by mouth 2 (two) times daily. ?  ?MULTIVITAMIN ADULT PO ?Take 1 tablet by mouth daily. ?  ?raloxifene 60 MG tablet ?Commonly known as: EVISTA ?Take 60 mg by mouth daily. ?  ?traMADol 50 MG tablet ?Commonly known as: ULTRAM ?Take 50 mg by mouth 2 (two) times daily as needed. ?  ?Vitamin D3 50 MCG (2000 UT) capsule ?Take 2,000 Units by mouth daily. ?  ? ?  ? ? ?Allergies: No Known Allergies ? ?Family History: ?Family History  ?Problem Relation Age of Onset  ? Diabetes Father   ? Diabetes Sister   ? Diabetes Brother   ? Diabetes Daughter   ? Diabetes Sister   ? Prostate cancer Neg Hx   ?  Kidney cancer Neg Hx   ? Bladder Cancer Neg Hx   ? ? ?Social History:  reports that she has never smoked. She has never used smokeless tobacco. She reports that she does not drink alcohol and does not use drugs. ? ?ROS: ?Pertinent ROS in HPI ? ?Physical Exam: ?There were no vitals taken for this visit.  ?Constitutional:  Well nourished. Alert and oriented, No acute distress. ?HEENT: [place] AT, moist mucus membranes.  Trachea midline, no masses. ?Cardiovascular: No clubbing, cyanosis, or edema. ?Respiratory: Normal respiratory effort, no increased work of breathing. ?GI: Abdomen is soft, non tender, non distended, no abdominal masses. Liver and spleen not palpable.  No hernias appreciated.  Stool sample for occult testing is not indicated.   ?GU: No CVA tenderness.  No bladder  fullness or masses.  *** external genitalia, *** pubic hair distribution, no lesions.  Normal urethral meatus, no lesions, no prolapse, no discharge.   No urethral masses, tenderness and/or tenderness. No bladder fullness, tenderness or masses. *** vagina mucosa, *** estrogen effect, no discharge, no lesions, *** pelvic support, *** cystocele and *** rectocele noted.  No cervical motion tenderness.  Uterus is freely mobile and non-fixed.  No adnexal/parametria masses or tenderness noted.  Anus and perineum are without rashes or lesions.   ***  ?Skin: No rashes, bruises or suspicious lesions. ?Lymph: No cervical or inguinal adenopathy. ?Neurologic: Grossly intact, no focal deficits, moving all 4 extremities. ?Psychiatric: Normal mood and affect.   ? ?Laboratory Data: ?WBC (White Blood Cell Count) 4.1 - 10.2 10?3/uL 12.7 High    ?RBC (Red Blood Cell Count) 4.04 - 5.48 10?6/uL 3.84 Low    ?Hemoglobin 12.0 - 15.0 gm/dL 11.7 Low    ?Hematocrit 35.0 - 47.0 % 35.3   ?MCV (Mean Corpuscular Volume) 80.0 - 100.0 fl 91.9   ?MCH (Mean Corpuscular Hemoglobin) 27.0 - 31.2 pg 30.5   ?MCHC (Mean Corpuscular Hemoglobin Concentration) 32.0 - 36.0 gm/dL 33.1   ?Platelet Count 150 - 450 10?3/uL 218   ?RDW-CV (Red Cell Distribution Width) 11.6 - 14.8 % 12.8   ?MPV (Mean Platelet Volume) 9.4 - 12.4 fl 11.3   ?Neutrophils 1.50 - 7.80 10?3/uL 9.00 High    ?Lymphocytes 1.00 - 3.60 10?3/uL 2.22   ?Monocytes 0.00 - 1.50 10?3/uL 0.88   ?Eosinophils 0.00 - 0.55 10?3/uL 0.42   ?Basophils 0.00 - 0.09 10?3/uL 0.10 High    ?Neutrophil % 32.0 - 70.0 % 71.1 High    ?Lymphocyte % 10.0 - 50.0 % 17.5   ?Monocyte % 4.0 - 13.0 % 7.0   ?Eosinophil % 1.0 - 5.0 % 3.3   ?Basophil% 0.0 - 2.0 % 0.8   ?Immature Granulocyte % <=0.7 % 0.3   ?Immature Granulocyte Count <=0.06 10^3/?L 0.04   ?Resulting Agency  [facility]  ?Specimen Collected: 06/02/21 09:23 Last Resulted: 06/02/21 10:16  ?Received From: [facility]  Result Received:  08/26/21 14:28  ? ?Glucose 70 - 110 mg/dL 155 High    ?Sodium 136 - 145 mmol/L 138   ?Potassium 3.6 - 5.1 mmol/L 5.1   ?Chloride 97 - 109 mmol/L 102   ?Carbon Dioxide (CO2) 22.0 - 32.0 mmol/L 27.9   ?Urea Nitrogen (BUN) 7 - 25 mg/dL 17   ?Creatinine 0.6 - 1.1 mg/dL 1.1   ?Glomerular Filtration Rate (eGFR), MDRD Estimate >60 mL/min/1.73sq m 47 Low    ?Calcium 8.7 - 10.3 mg/dL 10.4 High    ?AST  8 - 39 U/L 21   ?  ALT  5 - 38 U/L 12   ?Alk Phos (alkaline Phosphatase) 34 - 104 U/L 60   ?Albumin 3.5 - 4.8 g/dL 4.2   ?Bilirubin, Total 0.3 - 1.2 mg/dL 0.6   ?Protein, Total 6.1 - 7.9 g/dL 7.0   ?A/G Ratio 1.0 - 5.0 gm/dL 1.5   ?Resulting Agency  [facility]  ?Specimen Collected: 06/02/21 09:23 Last Resulted: 06/02/21 11:32  ?Received From: [facility]  Result Received: 08/26/21 14:28  ? ?Cholesterol, Total 100 - 200 mg/dL 142   ?Triglyceride 35 - 199 mg/dL 155   ?HDL (High Density Lipoprotein) Cholesterol 35.0 - 85.0 mg/dL 30.5 Low    ?LDL Calculated 0 - 130 mg/dL 81   ?VLDL Cholesterol mg/dL 31   ?Cholesterol/HDL Ratio  4.7   ?Resulting Agency  [facility]  ?Specimen Collected: 06/02/21 09:23 Last Resulted: 06/02/21 11:30  ?Received From: [facility]  Result Received: 08/26/21 14:28  ? ?Hemoglobin A1C 4.2 - 5.6 % 6.7 High    ?Average Blood Glucose (Calc) mg/dL 146   ?Resulting Agency  [facility]  ?Narrative ?Performed by [facility] ?Normal Range:    4.2 - 5.6%  ?Increased Risk:  5.7 - 6.4%  ?Diabetes:        >= 6.5%  ?Glycemic Control for adults with diabetes:  <7%   ?Specimen Collected: 06/02/21 09:23 Last Resulted: 06/02/21 13:05  ?Received From: [facility]  Result Received: 08/26/21 14:28  ? ?Urinalysis ?Color Yellow, Violet, Light Violet, Dark Violet Yellow   ?Clarity Clear, Other SL Cloudy Abnormal    ?Specific Gravity 1.000 - 1.030 1.010   ?pH, Urine 5.0 - 8.0 6.0   ?Protein, Urinalysis Negative, Trace  mg/dL Negative   ?Glucose, Urinalysis Negative mg/dL Negative   ?Ketones, Urinalysis Negative mg/dL Negative   ?Blood, Urinalysis Negative Moderate Abnormal    ?Nitrite, Urinalysis Negative Negative   ?

## 2021-11-04 ENCOUNTER — Ambulatory Visit: Payer: Self-pay | Admitting: Urology

## 2021-11-04 DIAGNOSIS — N952 Postmenopausal atrophic vaginitis: Secondary | ICD-10-CM

## 2021-11-04 DIAGNOSIS — R351 Nocturia: Secondary | ICD-10-CM

## 2021-11-04 DIAGNOSIS — N390 Urinary tract infection, site not specified: Secondary | ICD-10-CM

## 2021-12-02 DIAGNOSIS — E782 Mixed hyperlipidemia: Secondary | ICD-10-CM | POA: Diagnosis not present

## 2021-12-02 DIAGNOSIS — E1169 Type 2 diabetes mellitus with other specified complication: Secondary | ICD-10-CM | POA: Diagnosis not present

## 2021-12-09 DIAGNOSIS — G4733 Obstructive sleep apnea (adult) (pediatric): Secondary | ICD-10-CM | POA: Diagnosis not present

## 2021-12-09 DIAGNOSIS — Z9989 Dependence on other enabling machines and devices: Secondary | ICD-10-CM | POA: Diagnosis not present

## 2021-12-09 DIAGNOSIS — Z0000 Encounter for general adult medical examination without abnormal findings: Secondary | ICD-10-CM | POA: Diagnosis not present

## 2021-12-09 DIAGNOSIS — E538 Deficiency of other specified B group vitamins: Secondary | ICD-10-CM | POA: Diagnosis not present

## 2021-12-09 DIAGNOSIS — D500 Iron deficiency anemia secondary to blood loss (chronic): Secondary | ICD-10-CM | POA: Diagnosis not present

## 2021-12-09 DIAGNOSIS — E1169 Type 2 diabetes mellitus with other specified complication: Secondary | ICD-10-CM | POA: Diagnosis not present

## 2021-12-09 DIAGNOSIS — E782 Mixed hyperlipidemia: Secondary | ICD-10-CM | POA: Diagnosis not present

## 2022-01-08 DIAGNOSIS — D500 Iron deficiency anemia secondary to blood loss (chronic): Secondary | ICD-10-CM | POA: Diagnosis not present

## 2022-01-08 DIAGNOSIS — E538 Deficiency of other specified B group vitamins: Secondary | ICD-10-CM | POA: Diagnosis not present

## 2022-03-12 DIAGNOSIS — N390 Urinary tract infection, site not specified: Secondary | ICD-10-CM | POA: Diagnosis not present

## 2022-03-12 DIAGNOSIS — R319 Hematuria, unspecified: Secondary | ICD-10-CM | POA: Diagnosis not present

## 2022-03-12 DIAGNOSIS — R300 Dysuria: Secondary | ICD-10-CM | POA: Diagnosis not present

## 2022-03-12 DIAGNOSIS — Z87440 Personal history of urinary (tract) infections: Secondary | ICD-10-CM | POA: Diagnosis not present

## 2022-03-12 DIAGNOSIS — N898 Other specified noninflammatory disorders of vagina: Secondary | ICD-10-CM | POA: Diagnosis not present

## 2022-03-12 DIAGNOSIS — B3731 Acute candidiasis of vulva and vagina: Secondary | ICD-10-CM | POA: Diagnosis not present

## 2022-03-12 DIAGNOSIS — R350 Frequency of micturition: Secondary | ICD-10-CM | POA: Diagnosis not present

## 2022-03-14 ENCOUNTER — Emergency Department: Payer: PPO

## 2022-03-14 ENCOUNTER — Encounter: Payer: Self-pay | Admitting: Emergency Medicine

## 2022-03-14 ENCOUNTER — Emergency Department
Admission: EM | Admit: 2022-03-14 | Discharge: 2022-03-14 | Disposition: A | Discharge status: Home / Self Care | Payer: PPO | Attending: Emergency Medicine | Admitting: Emergency Medicine

## 2022-03-14 ENCOUNTER — Other Ambulatory Visit: Payer: Self-pay

## 2022-03-14 DIAGNOSIS — W19XXXA Unspecified fall, initial encounter: Secondary | ICD-10-CM | POA: Insufficient documentation

## 2022-03-14 DIAGNOSIS — R42 Dizziness and giddiness: Secondary | ICD-10-CM | POA: Diagnosis not present

## 2022-03-14 DIAGNOSIS — R000 Tachycardia, unspecified: Secondary | ICD-10-CM | POA: Diagnosis not present

## 2022-03-14 DIAGNOSIS — I10 Essential (primary) hypertension: Secondary | ICD-10-CM | POA: Insufficient documentation

## 2022-03-14 DIAGNOSIS — N390 Urinary tract infection, site not specified: Secondary | ICD-10-CM | POA: Diagnosis not present

## 2022-03-14 DIAGNOSIS — E119 Type 2 diabetes mellitus without complications: Secondary | ICD-10-CM | POA: Diagnosis not present

## 2022-03-14 DIAGNOSIS — I6381 Other cerebral infarction due to occlusion or stenosis of small artery: Secondary | ICD-10-CM | POA: Diagnosis not present

## 2022-03-14 LAB — CBC WITH DIFFERENTIAL/PLATELET
Abs Immature Granulocytes: 0.05 10*3/uL (ref 0.00–0.07)
Basophils Absolute: 0.1 10*3/uL (ref 0.0–0.1)
Basophils Relative: 1 %
Eosinophils Absolute: 0.4 10*3/uL (ref 0.0–0.5)
Eosinophils Relative: 4 %
HCT: 37.4 % (ref 36.0–46.0)
Hemoglobin: 12 g/dL (ref 12.0–15.0)
Immature Granulocytes: 0 %
Lymphocytes Relative: 22 %
Lymphs Abs: 2.5 10*3/uL (ref 0.7–4.0)
MCH: 29.2 pg (ref 26.0–34.0)
MCHC: 32.1 g/dL (ref 30.0–36.0)
MCV: 91 fL (ref 80.0–100.0)
Monocytes Absolute: 0.8 10*3/uL (ref 0.1–1.0)
Monocytes Relative: 7 %
Neutro Abs: 7.7 10*3/uL (ref 1.7–7.7)
Neutrophils Relative %: 66 %
Platelets: 213 10*3/uL (ref 150–400)
RBC: 4.11 MIL/uL (ref 3.87–5.11)
RDW: 12.8 % (ref 11.5–15.5)
WBC: 11.5 10*3/uL — ABNORMAL HIGH (ref 4.0–10.5)
nRBC: 0 % (ref 0.0–0.2)

## 2022-03-14 LAB — URINALYSIS, ROUTINE W REFLEX MICROSCOPIC
Bilirubin Urine: NEGATIVE
Glucose, UA: NEGATIVE mg/dL
Ketones, ur: NEGATIVE mg/dL
Nitrite: NEGATIVE
Protein, ur: 100 mg/dL — AB
Specific Gravity, Urine: 1.009 (ref 1.005–1.030)
WBC, UA: >50 WBC/hpf — ABNORMAL HIGH (ref 0–5)
pH: 7 (ref 5.0–8.0)

## 2022-03-14 LAB — BASIC METABOLIC PANEL
Anion gap: 7 (ref 5–15)
BUN: 18 mg/dL (ref 8–23)
CO2: 24 mmol/L (ref 22–32)
Calcium: 10 mg/dL (ref 8.9–10.3)
Chloride: 105 mmol/L (ref 98–111)
Creatinine, Ser: 0.97 mg/dL (ref 0.44–1.00)
GFR, Estimated: 56 mL/min — ABNORMAL LOW (ref >60)
Glucose, Bld: 156 mg/dL — ABNORMAL HIGH (ref 70–99)
Potassium: 4.1 mmol/L (ref 3.5–5.1)
Sodium: 136 mmol/L (ref 135–145)

## 2022-03-14 LAB — TROPONIN I (HIGH SENSITIVITY): Troponin I (High Sensitivity): 8 ng/L (ref <18)

## 2022-03-14 LAB — CK: Total CK: 90 U/L (ref 38–234)

## 2022-03-14 MED ORDER — CIPROFLOXACIN IN D5W 400 MG/200ML IV SOLN
400.0000 mg | Freq: Once | INTRAVENOUS | Status: AC
  Administered 2022-03-14: 400 mg via INTRAVENOUS
  Filled 2022-03-14: qty 200

## 2022-03-14 MED ORDER — LACTATED RINGERS IV BOLUS
1000.0000 mL | Freq: Once | INTRAVENOUS | Status: AC
  Administered 2022-03-14: 1000 mL via INTRAVENOUS

## 2022-03-14 MED ORDER — CIPROFLOXACIN HCL 500 MG PO TABS: 500.0000 mg | ORAL_TABLET | Freq: Two times a day (BID) | ORAL | 0 refills | Status: DC

## 2022-03-14 MED ORDER — CIPROFLOXACIN HCL 500 MG PO TABS: 500.0000 mg | ORAL_TABLET | Freq: Two times a day (BID) | ORAL | 0 refills | Status: AC

## 2022-03-14 NOTE — ED Triage Notes (Signed)
Per EMS pt coming from home states last night she attempted to use restroom at midnight- felt dizzy and tried to lower herself to ground. Patient states she did not fall but was unable to get herself off the ground. C/o bilateral leg weakness and her head not feeling right. Patient currently on antibiotics for a UTI.

## 2022-03-14 NOTE — ED Notes (Addendum)
The pt was assisted with ambulating with a walker. The pt advised she did feel slightly dizzy but thought that was due to her not sleeping at all last night.

## 2022-03-14 NOTE — ED Provider Notes (Signed)
[facility] Provider Note    Event Date/Time   First MD Initiated Contact with Patient 03/14/22 [phone]     (approximate)   History   Chief Complaint Fall and Dizziness   HPI  Jenny Pierce is a 88 y.o. female with past medical history of hypertension, diabetes, arthritis, and anxiety who presents to the ED complaining of fall and dizziness.  Patient reports that she got up to go to the bathroom around midnight last night and began to feel dizzy like the room was spinning around her.  She states that she lost her balance and had to lower herself to the ground, ended up falling onto her backside.  She denies hitting her head or losing consciousness, denies headache but reports her head "feels off."  She states she feels weak in both of her legs but denies any unilateral weakness, vision or speech difficulties.  She denies any pain in her trunk or extremities related to the fall.  She did not have any chest pain or shortness of breath with the episode.  She did have difficulty getting herself up off of the ground and remained there until she was found by family this morning.  She recently started on Cipro for a UTI 3 days ago, denies any fevers or flank pain, states dysuria has improved.     Physical Exam   Triage Vital Signs: ED Triage Vitals  Enc Vitals Group     BP 03/14/22 0724 (!) 189/77     Pulse Rate 03/14/22 0724 (!) 109     Resp 03/14/22 0724 18     Temp --      Temp src --      SpO2 03/14/22 0724 97 %     Weight 03/14/22 0725 175 lb (79.4 kg)     Height 03/14/22 0725 5\' 5"  (1.651 m)     Head Circumference --      Peak Flow --      Pain Score 03/14/22 0725 2     Pain Loc --      Pain Edu? --      Excl. in GC? --     Most recent vital signs: Vitals:   03/14/22 0741 03/14/22 1000  BP:  139/63  Pulse: (!) 103 87  Resp: 17 19  Temp: 97.9 F (36.6 C)   SpO2: 96% 97%    Constitutional: Alert and oriented. Eyes: Conjunctivae are  normal. Head: Atraumatic. Nose: No congestion/rhinnorhea. Mouth/Throat: Mucous membranes are moist.  Neck: No midline cervical spine tenderness to palpation. Cardiovascular: Normal rate, regular rhythm. Grossly normal heart sounds.  2+ radial pulses bilaterally. Respiratory: Normal respiratory effort.  No retractions. Lungs CTAB.  No chest wall tenderness to palpation. Gastrointestinal: Soft and nontender. No distention. Musculoskeletal: No lower extremity tenderness nor edema.  No upper extremity bony tenderness to palpation. Neurologic:  Normal speech and language. No gross focal neurologic deficits are appreciated.    ED Results / Procedures / Treatments   Labs (all labs ordered are listed, but only abnormal results are displayed) Labs Reviewed  CBC WITH DIFFERENTIAL/PLATELET - Abnormal; Notable for the following components:      Result Value   WBC 11.5 (*)    All other components within normal limits  BASIC METABOLIC PANEL - Abnormal; Notable for the following components:   Glucose, Bld 156 (*)    GFR, Estimated 56 (*)    All other components within normal limits  URINALYSIS, ROUTINE W REFLEX MICROSCOPIC -  Abnormal; Notable for the following components:   Color, Urine STRAW (*)    APPearance HAZY (*)    Hgb urine dipstick SMALL (*)    Protein, ur 100 (*)    Leukocytes,Ua LARGE (*)    WBC, UA >50 (*)    Bacteria, UA RARE (*)    All other components within normal limits  URINE CULTURE  CK  TROPONIN I (HIGH SENSITIVITY)     EKG  ED ECG REPORT I, Chesley Noon, the attending physician, personally viewed and interpreted this ECG.   Date: 03/14/2022  EKG Time: 7:29  Rate: 106  Rhythm: sinus tachycardia  Axis: Normal  Intervals:none  ST&T Change: None  RADIOLOGY CT head reviewed and interpreted by me with no hemorrhage or midline shift.  PROCEDURES:  Critical Care performed: No  Procedures   MEDICATIONS ORDERED IN ED: Medications  ciprofloxacin (CIPRO)  IVPB 400 mg (400 mg Intravenous New Bag/Given 03/14/22 0949)  lactated ringers bolus 1,000 mL (1,000 mLs Intravenous New Bag/Given 03/14/22 0744)     IMPRESSION / MDM / ASSESSMENT AND PLAN / ED COURSE  I reviewed the triage vital signs and the nursing notes.                              88 y.o. female with past medical history of hypertension, diabetes, arthritis, and anxiety who presents to the ED following episode of dizziness and fall last night at midnight, reports weakness in both legs and remained on the ground following the fall until found by family this morning.  Patient's presentation is most consistent with acute presentation with potential threat to life or bodily function.  Differential diagnosis includes, but is not limited to, ACS, arrhythmia, electrolyte abnormality, AKI, anemia, central vertigo, peripheral vertigo, stroke, TIA, UTI.  Patient well-appearing and in no acute distress, vital signs remarkable for mild tachycardia but otherwise reassuring.  She reports weakness in both of her legs, states dizziness is improved, no focal neurologic deficits noted on exam and patient at her baseline mental status.  No evidence of significant trauma on exam, but with her dizziness and fall, we will check CT head.  EKG shows sinus tachycardia with no ischemic changes, low suspicion for cardiac etiology but we will screen troponin and observe on cardiac monitor.  Additional labs and urinalysis are also pending, we will hydrate with IV fluids and reassess.  CT head is negative for acute process, labs are reassuring with no significant anemia, leukocytosis, electrolyte abnormality, or AKI.  Troponin within normal limits and I doubt cardiac etiology for her symptoms, CK level is also unremarkable.  Patient reports feeling better following IV fluid bolus and was able to ambulate with assistance similar to baseline.  Urinalysis does appear concerning for ongoing UTI, we will send for culture,  unfortunately does not appear culture data from recent PCP visit is available.  Given concern for worsening UTI, we will transition patient from cefdinir to Cipro, initial IV dose given here in the ED.  She was counseled to follow-up with her PCP and to return to the ED for new or worsening symptoms, patient and family agree with plan.      FINAL CLINICAL IMPRESSION(S) / ED DIAGNOSES   Final diagnoses:  Lower urinary tract infectious disease  Dizziness     Rx / DC Orders   ED Discharge Orders          Ordered    ciprofloxacin (CIPRO)  500 MG tablet  2 times daily        03/14/22 1041             Note:  This document was prepared using Dragon voice recognition software and may include unintentional dictation errors.   Chesley Noon, MD 03/14/22 1044

## 2022-03-14 NOTE — ED Notes (Signed)
Patients son at bedside and updated with plan of care.

## 2022-03-15 LAB — URINE CULTURE: Culture: NO GROWTH

## 2022-03-23 DIAGNOSIS — I10 Essential (primary) hypertension: Secondary | ICD-10-CM | POA: Diagnosis not present

## 2022-03-23 DIAGNOSIS — N3001 Acute cystitis with hematuria: Secondary | ICD-10-CM | POA: Diagnosis not present

## 2022-03-23 DIAGNOSIS — N3281 Overactive bladder: Secondary | ICD-10-CM | POA: Diagnosis not present

## 2022-03-31 DIAGNOSIS — N3001 Acute cystitis with hematuria: Secondary | ICD-10-CM | POA: Diagnosis not present

## 2022-06-04 DIAGNOSIS — E538 Deficiency of other specified B group vitamins: Secondary | ICD-10-CM | POA: Diagnosis not present

## 2022-06-04 DIAGNOSIS — E782 Mixed hyperlipidemia: Secondary | ICD-10-CM | POA: Diagnosis not present

## 2022-06-04 DIAGNOSIS — D500 Iron deficiency anemia secondary to blood loss (chronic): Secondary | ICD-10-CM | POA: Diagnosis not present

## 2022-06-04 DIAGNOSIS — E1169 Type 2 diabetes mellitus with other specified complication: Secondary | ICD-10-CM | POA: Diagnosis not present

## 2022-06-11 DIAGNOSIS — N8110 Cystocele, unspecified: Secondary | ICD-10-CM | POA: Diagnosis not present

## 2022-06-11 DIAGNOSIS — E1169 Type 2 diabetes mellitus with other specified complication: Secondary | ICD-10-CM | POA: Diagnosis not present

## 2022-06-11 DIAGNOSIS — Z23 Encounter for immunization: Secondary | ICD-10-CM | POA: Diagnosis not present

## 2022-06-11 DIAGNOSIS — E782 Mixed hyperlipidemia: Secondary | ICD-10-CM | POA: Diagnosis not present

## 2022-06-11 DIAGNOSIS — D500 Iron deficiency anemia secondary to blood loss (chronic): Secondary | ICD-10-CM | POA: Diagnosis not present

## 2022-06-11 DIAGNOSIS — M5136 Other intervertebral disc degeneration, lumbar region: Secondary | ICD-10-CM | POA: Diagnosis not present

## 2022-07-16 DIAGNOSIS — N898 Other specified noninflammatory disorders of vagina: Secondary | ICD-10-CM | POA: Diagnosis not present

## 2022-07-16 DIAGNOSIS — R351 Nocturia: Secondary | ICD-10-CM | POA: Diagnosis not present

## 2022-07-16 DIAGNOSIS — N390 Urinary tract infection, site not specified: Secondary | ICD-10-CM | POA: Diagnosis not present

## 2022-08-03 DIAGNOSIS — M2041 Other hammer toe(s) (acquired), right foot: Secondary | ICD-10-CM | POA: Diagnosis not present

## 2022-08-03 DIAGNOSIS — B351 Tinea unguium: Secondary | ICD-10-CM | POA: Diagnosis not present

## 2022-08-03 DIAGNOSIS — L600 Ingrowing nail: Secondary | ICD-10-CM | POA: Diagnosis not present

## 2022-08-03 DIAGNOSIS — M2042 Other hammer toe(s) (acquired), left foot: Secondary | ICD-10-CM | POA: Diagnosis not present

## 2022-08-03 DIAGNOSIS — L851 Acquired keratosis [keratoderma] palmaris et plantaris: Secondary | ICD-10-CM | POA: Diagnosis not present

## 2022-08-03 DIAGNOSIS — E1140 Type 2 diabetes mellitus with diabetic neuropathy, unspecified: Secondary | ICD-10-CM | POA: Diagnosis not present

## 2022-12-10 DIAGNOSIS — E1169 Type 2 diabetes mellitus with other specified complication: Secondary | ICD-10-CM | POA: Diagnosis not present

## 2022-12-10 DIAGNOSIS — E782 Mixed hyperlipidemia: Secondary | ICD-10-CM | POA: Diagnosis not present

## 2022-12-10 DIAGNOSIS — D500 Iron deficiency anemia secondary to blood loss (chronic): Secondary | ICD-10-CM | POA: Diagnosis not present

## 2022-12-17 DIAGNOSIS — E538 Deficiency of other specified B group vitamins: Secondary | ICD-10-CM | POA: Diagnosis not present

## 2022-12-17 DIAGNOSIS — N390 Urinary tract infection, site not specified: Secondary | ICD-10-CM | POA: Diagnosis not present

## 2022-12-17 DIAGNOSIS — E782 Mixed hyperlipidemia: Secondary | ICD-10-CM | POA: Diagnosis not present

## 2022-12-17 DIAGNOSIS — N1831 Chronic kidney disease, stage 3a: Secondary | ICD-10-CM | POA: Diagnosis not present

## 2022-12-17 DIAGNOSIS — E1169 Type 2 diabetes mellitus with other specified complication: Secondary | ICD-10-CM | POA: Diagnosis not present

## 2022-12-17 DIAGNOSIS — Z0000 Encounter for general adult medical examination without abnormal findings: Secondary | ICD-10-CM | POA: Diagnosis not present

## 2022-12-17 DIAGNOSIS — D500 Iron deficiency anemia secondary to blood loss (chronic): Secondary | ICD-10-CM | POA: Diagnosis not present

## 2023-01-15 DIAGNOSIS — E538 Deficiency of other specified B group vitamins: Secondary | ICD-10-CM | POA: Diagnosis not present

## 2023-01-15 DIAGNOSIS — N1831 Chronic kidney disease, stage 3a: Secondary | ICD-10-CM | POA: Diagnosis not present

## 2023-01-15 DIAGNOSIS — I129 Hypertensive chronic kidney disease with stage 1 through stage 4 chronic kidney disease, or unspecified chronic kidney disease: Secondary | ICD-10-CM | POA: Diagnosis not present

## 2023-01-15 DIAGNOSIS — D500 Iron deficiency anemia secondary to blood loss (chronic): Secondary | ICD-10-CM | POA: Diagnosis not present

## 2023-05-06 DIAGNOSIS — H353132 Nonexudative age-related macular degeneration, bilateral, intermediate dry stage: Secondary | ICD-10-CM | POA: Diagnosis not present

## 2023-05-06 DIAGNOSIS — E119 Type 2 diabetes mellitus without complications: Secondary | ICD-10-CM | POA: Diagnosis not present

## 2023-05-06 DIAGNOSIS — Z961 Presence of intraocular lens: Secondary | ICD-10-CM | POA: Diagnosis not present

## 2023-05-06 DIAGNOSIS — H35373 Puckering of macula, bilateral: Secondary | ICD-10-CM | POA: Diagnosis not present

## 2023-06-08 DIAGNOSIS — H6121 Impacted cerumen, right ear: Secondary | ICD-10-CM | POA: Diagnosis not present

## 2023-06-08 DIAGNOSIS — H903 Sensorineural hearing loss, bilateral: Secondary | ICD-10-CM | POA: Diagnosis not present

## 2023-06-14 DIAGNOSIS — E1169 Type 2 diabetes mellitus with other specified complication: Secondary | ICD-10-CM | POA: Diagnosis not present

## 2023-06-14 DIAGNOSIS — E782 Mixed hyperlipidemia: Secondary | ICD-10-CM | POA: Diagnosis not present

## 2023-06-14 DIAGNOSIS — E875 Hyperkalemia: Secondary | ICD-10-CM | POA: Diagnosis not present

## 2023-06-14 DIAGNOSIS — E538 Deficiency of other specified B group vitamins: Secondary | ICD-10-CM | POA: Diagnosis not present

## 2023-06-15 DIAGNOSIS — E782 Mixed hyperlipidemia: Secondary | ICD-10-CM | POA: Diagnosis not present

## 2023-06-15 DIAGNOSIS — E875 Hyperkalemia: Secondary | ICD-10-CM | POA: Diagnosis not present

## 2023-06-15 DIAGNOSIS — E1169 Type 2 diabetes mellitus with other specified complication: Secondary | ICD-10-CM | POA: Diagnosis not present

## 2023-06-16 DIAGNOSIS — H04123 Dry eye syndrome of bilateral lacrimal glands: Secondary | ICD-10-CM | POA: Diagnosis not present

## 2023-06-16 DIAGNOSIS — H18513 Endothelial corneal dystrophy, bilateral: Secondary | ICD-10-CM | POA: Diagnosis not present

## 2023-06-16 DIAGNOSIS — H353132 Nonexudative age-related macular degeneration, bilateral, intermediate dry stage: Secondary | ICD-10-CM | POA: Diagnosis not present

## 2023-06-21 DIAGNOSIS — Z23 Encounter for immunization: Secondary | ICD-10-CM | POA: Diagnosis not present

## 2023-06-21 DIAGNOSIS — G4733 Obstructive sleep apnea (adult) (pediatric): Secondary | ICD-10-CM | POA: Diagnosis not present

## 2023-06-21 DIAGNOSIS — E1169 Type 2 diabetes mellitus with other specified complication: Secondary | ICD-10-CM | POA: Diagnosis not present

## 2023-06-21 DIAGNOSIS — E782 Mixed hyperlipidemia: Secondary | ICD-10-CM | POA: Diagnosis not present

## 2023-07-05 DIAGNOSIS — R42 Dizziness and giddiness: Secondary | ICD-10-CM | POA: Diagnosis not present

## 2023-07-05 DIAGNOSIS — N952 Postmenopausal atrophic vaginitis: Secondary | ICD-10-CM | POA: Diagnosis not present

## 2023-08-04 ENCOUNTER — Ambulatory Visit: Payer: PPO | Admitting: Physician Assistant

## 2023-08-04 ENCOUNTER — Encounter: Payer: Self-pay | Admitting: Physician Assistant

## 2023-08-04 VITALS — BP 190/81 | HR 93 | Ht 65.0 in | Wt 172.4 lb

## 2023-08-04 DIAGNOSIS — R350 Frequency of micturition: Secondary | ICD-10-CM | POA: Diagnosis not present

## 2023-08-04 DIAGNOSIS — N3281 Overactive bladder: Secondary | ICD-10-CM

## 2023-08-04 DIAGNOSIS — L900 Lichen sclerosus et atrophicus: Secondary | ICD-10-CM

## 2023-08-04 DIAGNOSIS — N3941 Urge incontinence: Secondary | ICD-10-CM

## 2023-08-04 DIAGNOSIS — I863 Vulval varices: Secondary | ICD-10-CM | POA: Diagnosis not present

## 2023-08-04 DIAGNOSIS — R82998 Other abnormal findings in urine: Secondary | ICD-10-CM | POA: Diagnosis not present

## 2023-08-04 DIAGNOSIS — R300 Dysuria: Secondary | ICD-10-CM | POA: Diagnosis not present

## 2023-08-04 MED ORDER — CLOBETASOL PROPIONATE 0.05 % EX OINT: 1.0000 | TOPICAL_OINTMENT | Freq: Every day | CUTANEOUS | 2 refills | Status: AC

## 2023-08-04 MED ORDER — GEMTESA 75 MG PO TABS: 75.0000 mg | ORAL_TABLET | Freq: Every day | ORAL | Status: DC

## 2023-08-04 NOTE — Progress Notes (Signed)
 08/04/2023 3:11 PM   [name] 07-18-1930 [id number]  CC: Chief Complaint  Patient presents with   Hematuria   HPI: Jenny Pierce is a 89 y.o. female with PMH OSA with nocturia, diabetes, lichen sclerosis previously on clobetasol  cream, GSM, recurrent UTI, and microscopic hematuria with benign workup in 2022 who presents today for evaluation of possible UTI.  She is accompanied today by her daughter, who contributes to HPI.  Today she reports chronic urinary frequency every 30 to 60 minutes, urgency, and urge incontinence.  She wears 3-4 pads daily, stacked, as well as 1 pull-up overnight.  These can be saturated.  She reports that antibiotics have not improved the symptoms in the past.  She is no longer taking clobetasol  for her lichen sclerosus and also describes chronic vulvovaginal itching and irritation.  She has been using vaginal sill and A&E ointment.  She also notes some bumps on her left labia majora.  In-office catheterized UA today positive for trace ketones, 2+ blood, 3+ protein, and 2+ leukocytes; urine microscopy with >30 WBCs/HPF, 3-10 RBCs/HPF, and moderate bacteria.  Measured residual , last void 1 hour prior.  PMH: Past Medical History:  Diagnosis Date   Anxiety    Arthritis    Diabetes mellitus without complication (HCC)    Hypertension    Sleep apnea     Surgical History: Past Surgical History:  Procedure Laterality Date   APPENDECTOMY     CATARACT EXTRACTION, BILATERAL     REPLACEMENT TOTAL KNEE Bilateral    TONSILLECTOMY AND ADENOIDECTOMY     TUBAL LIGATION      Home Medications:  Allergies as of 08/04/2023   No Known Allergies      Medication List        Accurate as of August 04, 2023  3:11 PM. If you have any questions, ask your nurse or doctor.          STOP taking these medications    metFORMIN 500 MG tablet Commonly known as: GLUCOPHAGE Stopped by: Adalida Garver       TAKE these medications    ALPRAZolam  0.25 MG tablet Commonly known as: XANAX Take 0.25 mg by mouth daily as needed.   amLODipine 2.5 MG tablet Commonly known as: NORVASC Take 2.5 mg by mouth 2 (two) times daily.   aspirin EC 81 MG tablet Take 81 mg by mouth daily.   BIOTIN PO Take 1 capsule by mouth daily.   CALCIUM 600 PO Take 1 tablet by mouth 2 (two) times daily.   CINNAMON PO Take 1 capsule by mouth daily.   hydrochlorothiazide 12.5 MG capsule Commonly known as: MICROZIDE Take 12.5 mg by mouth daily.   lisinopril 5 MG tablet Commonly known as: ZESTRIL Take 5 mg by mouth daily.   meloxicam 15 MG tablet Commonly known as: MOBIC Take 15 mg by mouth daily.   metoprolol succinate 25 MG 24 hr tablet Commonly known as: TOPROL-XL Take 12.5 mg by mouth 2 (two) times daily.   MULTIVITAMIN ADULT PO Take 1 tablet by mouth daily.   raloxifene 60 MG tablet Commonly known as: EVISTA Take 60 mg by mouth daily.   traMADol  50 MG tablet Commonly known as: ULTRAM  Take 50 mg by mouth 2 (two) times daily as needed.   Vitamin D3 50 MCG (2000 UT) capsule Take 2,000 Units by mouth daily.        Allergies:  No Known Allergies  Family History: Family History  Problem Relation Age  of Onset   Diabetes Father    Diabetes Sister    Diabetes Brother    Diabetes Daughter    Diabetes Sister    Prostate cancer Neg Hx    Kidney cancer Neg Hx    Bladder Cancer Neg Hx     Social History:   reports that she has never smoked. She has never used smokeless tobacco. She reports that she does not drink alcohol and does not use drugs.  Physical Exam: BP (!) 190/81   Pulse 93   Ht 5' 5 (1.651 m)   Wt 172 lb 6.4 oz (78.2 kg)   BMI 28.69 kg/m   Constitutional:  Alert and oriented, no acute distress, nontoxic appearing HEENT: [place], AT Cardiovascular: No clubbing, cyanosis, or edema Respiratory: Normal respiratory effort, no increased work of breathing GU: Varicose vein of the left labia majora, not edematous or  erythematous.  Vulvovaginal atrophy and pallor/scarring consistent with known lichen sclerosus Skin: No rashes, bruises or suspicious lesions Neurologic: Grossly intact, no focal deficits, moving all 4 extremities Psychiatric: Normal mood and affect  Laboratory Data: Results for orders placed or performed in visit on 08/04/23  Microscopic Examination   Collection Time: 08/04/23  3:23 PM   Urine  Result Value Ref Range   WBC, UA >30 (A) 0 - 5 /hpf   RBC, Urine 3-10 (A) 0 - 2 /hpf   Epithelial Cells (non renal) 0-10 0 - 10 /hpf   Bacteria, UA Moderate (A) None seen/Few  Urinalysis, Complete   Collection Time: 08/04/23  3:23 PM  Result Value Ref Range   Specific Gravity, UA 1.025 1.005 - 1.030   pH, UA 7.0 5.0 - 7.5   Color, UA Yellow Yellow   Appearance Ur Cloudy (A) Clear   Leukocytes,UA 2+ (A) Negative   Protein,UA 3+ (A) Negative/Trace   Glucose, UA Negative Negative   Ketones, UA Trace (A) Negative   RBC, UA 2+ (A) Negative   Bilirubin, UA Negative Negative   Urobilinogen, Ur 0.2 0.2 - 1.0 mg/dL   Nitrite, UA Negative Negative   Microscopic Examination See below:    In and Out Catheterization  Patient is present today for a I & O catheterization due to dysuria. Patient was cleaned and prepped in a sterile fashion with betadine . A 14FR cath was inserted no complications were noted , 30ml of urine return was noted, urine was yellow in color. A clean urine sample was collected for UA/cx. Bladder was drained and catheter was removed without difficulty.    Performed by: Delwyn Scoggin, PA-C   Assessment & Plan:   1. Lichen sclerosus (Primary) Chronic vulvovaginal itching/irritation, no longer on clobetasol .  Will plan to resume this.  After 3 months of daily use, we can reduce her frequency of application.  If no improvement, will send her back to gynecology for further evaluation. - clobetasol  ointment (TEMOVATE ) 0.05 %; Apply 1 Application topically at bedtime.   Dispense: 30 g; Refill: 2  2. OAB (overactive bladder) Chronic urgency, frequency, and urge incontinence with no prior improvement on antibiotics.  I think she is chronically colonized and her positive UAs are a red herring.  I gave her Gemtesa  samples today and we will see her back for symptom recheck in 6 weeks.  I am also sending her urine for culture, but will defer antibiotics pending symptom recheck when results are available. - Urinalysis, Complete - CULTURE, URINE COMPREHENSIVE - Vibegron  (GEMTESA ) 75 MG TABS; Take 1 tablet (75 mg  total) by mouth daily.  3. Varicose veins of vulva and perineum Varicose vein of the left labia majora accounts for the bump she feels.  This does not appear inflamed or irritated, will continue to monitor.  Return in about 6 weeks (around 09/15/2023) for Symptom recheck with PVR.  Lucie Hones, PA-C  [facility] [place] [address] [place], [place] [address] [phone]

## 2023-08-05 LAB — URINALYSIS, COMPLETE
Bilirubin, UA: NEGATIVE
Glucose, UA: NEGATIVE
Nitrite, UA: NEGATIVE
Specific Gravity, UA: 1.025 (ref 1.005–1.030)
Urobilinogen, Ur: 0.2 mg/dL (ref 0.2–1.0)
pH, UA: 7 (ref 5.0–7.5)

## 2023-08-05 LAB — MICROSCOPIC EXAMINATION: WBC, UA: >30 /[HPF] — AB (ref 0–5)

## 2023-08-06 LAB — CULTURE, URINE COMPREHENSIVE

## 2023-09-15 ENCOUNTER — Ambulatory Visit: Payer: Self-pay | Admitting: Physician Assistant

## 2023-09-17 ENCOUNTER — Encounter: Payer: Self-pay | Admitting: Physician Assistant

## 2023-09-17 ENCOUNTER — Ambulatory Visit: Payer: PPO | Admitting: Physician Assistant

## 2023-09-17 VITALS — BP 149/71 | HR 99 | Ht 65.0 in | Wt 172.0 lb

## 2023-09-17 DIAGNOSIS — N3281 Overactive bladder: Secondary | ICD-10-CM

## 2023-09-17 DIAGNOSIS — B351 Tinea unguium: Secondary | ICD-10-CM | POA: Diagnosis not present

## 2023-09-17 DIAGNOSIS — L97511 Non-pressure chronic ulcer of other part of right foot limited to breakdown of skin: Secondary | ICD-10-CM | POA: Diagnosis not present

## 2023-09-17 DIAGNOSIS — L900 Lichen sclerosus et atrophicus: Secondary | ICD-10-CM

## 2023-09-17 DIAGNOSIS — R300 Dysuria: Secondary | ICD-10-CM

## 2023-09-17 DIAGNOSIS — E1140 Type 2 diabetes mellitus with diabetic neuropathy, unspecified: Secondary | ICD-10-CM | POA: Diagnosis not present

## 2023-09-17 LAB — BLADDER SCAN AMB NON-IMAGING: PVR: 0 WU

## 2023-09-17 MED ORDER — GEMTESA 75 MG PO TABS: 75.0000 mg | ORAL_TABLET | Freq: Every day | ORAL | 11 refills | Status: AC

## 2023-09-17 NOTE — Progress Notes (Signed)
 09/17/2023 3:36 PM   Jenny Pierce 1929-10-06 [id number]  CC: Chief Complaint  Patient presents with   Hematuria   HPI: Jenny Pierce is a 89 y.o. female with PMH OSA with nocturia, diabetes, lichen sclerosus, GSM, recurrent UTI, and microscopic hematuria with benign workup in 2022 who presents today for symptom recheck on clobetasol and Gemtesa.   Today she reports she is using clobetasol ointment nightly and topical vaginal estrogen cream 3 times weekly and these are helping, however she is still having some burning with urination.  She thinks the burning is on the vulva consistent with past reports.  She saw a small blood clot on her pad about a week ago.  She denies fever, chills, nausea, vomiting, and gross hematuria.  She thinks the Leslye Peer is helping and she is going less often and having less leakage, however she is having a hard time quantifying that today.  She thinks she can go an hour without urinating.  PVR 0mL.  PMH: Past Medical History:  Diagnosis Date   Anxiety    Arthritis    Diabetes mellitus without complication (HCC)    Hypertension    Sleep apnea     Surgical History: Past Surgical History:  Procedure Laterality Date   APPENDECTOMY     CATARACT EXTRACTION, BILATERAL     REPLACEMENT TOTAL KNEE Bilateral    TONSILLECTOMY AND ADENOIDECTOMY     TUBAL LIGATION      Home Medications:  Allergies as of 09/17/2023   No Known Allergies      Medication List        Accurate as of September 17, 2023  3:36 PM. If you have any questions, ask your nurse or doctor.          ALPRAZolam 0.25 MG tablet Commonly known as: XANAX Take 0.25 mg by mouth daily as needed.   amLODipine 2.5 MG tablet Commonly known as: NORVASC Take 2.5 mg by mouth 2 (two) times daily.   aspirin EC 81 MG tablet Take 81 mg by mouth daily.   BIOTIN PO Take 1 capsule by mouth daily.   CALCIUM 600 PO Take 1 tablet by mouth 2 (two) times daily.   CINNAMON PO Take 1 capsule  by mouth daily.   clobetasol ointment 0.05 % Commonly known as: TEMOVATE Apply 1 Application topically at bedtime.   Gemtesa 75 MG Tabs Generic drug: Vibegron Take 1 tablet (75 mg total) by mouth daily.   hydrochlorothiazide 12.5 MG capsule Commonly known as: MICROZIDE Take 12.5 mg by mouth daily.   lisinopril 5 MG tablet Commonly known as: ZESTRIL Take 5 mg by mouth daily.   meloxicam 15 MG tablet Commonly known as: MOBIC Take 15 mg by mouth daily.   metoprolol succinate 25 MG 24 hr tablet Commonly known as: TOPROL-XL Take 12.5 mg by mouth 2 (two) times daily.   MULTIVITAMIN ADULT PO Take 1 tablet by mouth daily.   raloxifene 60 MG tablet Commonly known as: EVISTA Take 60 mg by mouth daily.   traMADol 50 MG tablet Commonly known as: ULTRAM Take 50 mg by mouth 2 (two) times daily as needed.   Vitamin D3 50 MCG (2000 UT) capsule Take 2,000 Units by mouth daily.        Allergies:  No Known Allergies  Family History: Family History  Problem Relation Age of Onset   Diabetes Father    Diabetes Sister    Diabetes Brother    Diabetes Daughter  Diabetes Sister    Prostate cancer Neg Hx    Kidney cancer Neg Hx    Bladder Cancer Neg Hx     Social History:   reports that she has never smoked. She has never used smokeless tobacco. She reports that she does not drink alcohol and does not use drugs.  Physical Exam: BP (!) 149/71   Pulse 99   Ht 5\' 5"  (1.651 m)   Wt 172 lb (78 kg)   BMI 28.62 kg/m   Constitutional:  Alert and oriented, no acute distress, nontoxic appearing HEENT: [place], AT Cardiovascular: No clubbing, cyanosis, or edema Respiratory: Normal respiratory effort, no increased work of breathing Skin: No rashes, bruises or suspicious lesions Neurologic: Grossly intact, no focal deficits, moving all 4 extremities Psychiatric: Normal mood and affect  Laboratory Data: Results for orders placed or performed in visit on 09/17/23  Bladder Scan  (Post Void Residual) in office   Collection Time: 09/17/23  3:31 PM  Result Value Ref Range   PVR 0.0 WU   Assessment & Plan:   1. OAB (overactive bladder) (Primary) Subjective improvement on Gemtesa, she is emptying appropriately. Will continue. - Bladder Scan (Post Void Residual) in office - Vibegron (GEMTESA) 75 MG TABS; Take 1 tablet (75 mg total) by mouth daily.  Dispense: 30 tablet; Refill: 11  2. Lichen sclerosus Continue clobetasol.  3. Burning with urination Unclear if vulvovaginal 2/2 #2 above and GSM versus true dysuria. Will have her drop off a urine sample for UA/cx next week and call with results. We discussed consideration of cysto, especially in light of her history of microscopic hematuria and seeing a clot on her pad last week, but she prefers to defer this for now pending recurrent bleeding/clot. I think this is reasonable given her age. - Urinalysis, Complete; Future - CULTURE, URINE COMPREHENSIVE; Future   Return for Drop off urine specimen, will call with results.  Carman Ching, PA-C  [facility] [place] [address] [place], [place] [address] [phone]

## 2023-10-18 DIAGNOSIS — M5010 Cervical disc disorder with radiculopathy, unspecified cervical region: Secondary | ICD-10-CM | POA: Diagnosis not present

## 2023-10-18 DIAGNOSIS — E1169 Type 2 diabetes mellitus with other specified complication: Secondary | ICD-10-CM | POA: Diagnosis not present

## 2023-10-18 DIAGNOSIS — G4733 Obstructive sleep apnea (adult) (pediatric): Secondary | ICD-10-CM | POA: Diagnosis not present

## 2023-10-18 DIAGNOSIS — N761 Subacute and chronic vaginitis: Secondary | ICD-10-CM | POA: Diagnosis not present

## 2023-10-18 DIAGNOSIS — E782 Mixed hyperlipidemia: Secondary | ICD-10-CM | POA: Diagnosis not present

## 2023-12-07 DIAGNOSIS — M2041 Other hammer toe(s) (acquired), right foot: Secondary | ICD-10-CM | POA: Diagnosis not present

## 2023-12-07 DIAGNOSIS — M79671 Pain in right foot: Secondary | ICD-10-CM | POA: Diagnosis not present

## 2023-12-14 DIAGNOSIS — E1169 Type 2 diabetes mellitus with other specified complication: Secondary | ICD-10-CM | POA: Diagnosis not present

## 2023-12-14 DIAGNOSIS — E782 Mixed hyperlipidemia: Secondary | ICD-10-CM | POA: Diagnosis not present

## 2023-12-21 DIAGNOSIS — E1169 Type 2 diabetes mellitus with other specified complication: Secondary | ICD-10-CM | POA: Diagnosis not present

## 2023-12-21 DIAGNOSIS — Z1331 Encounter for screening for depression: Secondary | ICD-10-CM | POA: Diagnosis not present

## 2023-12-21 DIAGNOSIS — E782 Mixed hyperlipidemia: Secondary | ICD-10-CM | POA: Diagnosis not present

## 2023-12-21 DIAGNOSIS — Z0000 Encounter for general adult medical examination without abnormal findings: Secondary | ICD-10-CM | POA: Diagnosis not present

## 2023-12-23 DIAGNOSIS — Z961 Presence of intraocular lens: Secondary | ICD-10-CM | POA: Diagnosis not present

## 2023-12-23 DIAGNOSIS — E119 Type 2 diabetes mellitus without complications: Secondary | ICD-10-CM | POA: Diagnosis not present

## 2023-12-23 DIAGNOSIS — H353132 Nonexudative age-related macular degeneration, bilateral, intermediate dry stage: Secondary | ICD-10-CM | POA: Diagnosis not present

## 2023-12-23 DIAGNOSIS — H35373 Puckering of macula, bilateral: Secondary | ICD-10-CM | POA: Diagnosis not present

## 2024-02-14 DIAGNOSIS — E663 Overweight: Secondary | ICD-10-CM | POA: Diagnosis not present

## 2024-02-14 DIAGNOSIS — E1122 Type 2 diabetes mellitus with diabetic chronic kidney disease: Secondary | ICD-10-CM | POA: Diagnosis not present

## 2024-02-14 DIAGNOSIS — G4733 Obstructive sleep apnea (adult) (pediatric): Secondary | ICD-10-CM | POA: Diagnosis not present

## 2024-02-14 DIAGNOSIS — N1831 Chronic kidney disease, stage 3a: Secondary | ICD-10-CM | POA: Diagnosis not present

## 2024-02-14 DIAGNOSIS — H9193 Unspecified hearing loss, bilateral: Secondary | ICD-10-CM | POA: Diagnosis not present

## 2024-02-14 DIAGNOSIS — I129 Hypertensive chronic kidney disease with stage 1 through stage 4 chronic kidney disease, or unspecified chronic kidney disease: Secondary | ICD-10-CM | POA: Diagnosis not present

## 2024-02-14 DIAGNOSIS — G8929 Other chronic pain: Secondary | ICD-10-CM | POA: Diagnosis not present

## 2024-02-14 DIAGNOSIS — E1142 Type 2 diabetes mellitus with diabetic polyneuropathy: Secondary | ICD-10-CM | POA: Diagnosis not present

## 2024-02-14 DIAGNOSIS — K219 Gastro-esophageal reflux disease without esophagitis: Secondary | ICD-10-CM | POA: Diagnosis not present

## 2024-02-14 DIAGNOSIS — M810 Age-related osteoporosis without current pathological fracture: Secondary | ICD-10-CM | POA: Diagnosis not present

## 2024-02-14 DIAGNOSIS — F419 Anxiety disorder, unspecified: Secondary | ICD-10-CM | POA: Diagnosis not present

## 2024-02-14 DIAGNOSIS — M1990 Unspecified osteoarthritis, unspecified site: Secondary | ICD-10-CM | POA: Diagnosis not present

## 2024-03-21 DIAGNOSIS — N3090 Cystitis, unspecified without hematuria: Secondary | ICD-10-CM | POA: Diagnosis not present

## 2024-05-25 DIAGNOSIS — E1169 Type 2 diabetes mellitus with other specified complication: Secondary | ICD-10-CM | POA: Diagnosis not present

## 2024-05-25 DIAGNOSIS — R21 Rash and other nonspecific skin eruption: Secondary | ICD-10-CM | POA: Diagnosis not present

## 2024-05-25 DIAGNOSIS — R399 Unspecified symptoms and signs involving the genitourinary system: Secondary | ICD-10-CM | POA: Diagnosis not present

## 2024-05-25 DIAGNOSIS — R300 Dysuria: Secondary | ICD-10-CM | POA: Diagnosis not present

## 2024-05-25 DIAGNOSIS — E782 Mixed hyperlipidemia: Secondary | ICD-10-CM | POA: Diagnosis not present

## 2024-06-07 DIAGNOSIS — L821 Other seborrheic keratosis: Secondary | ICD-10-CM | POA: Diagnosis not present

## 2024-06-09 DIAGNOSIS — R21 Rash and other nonspecific skin eruption: Secondary | ICD-10-CM | POA: Diagnosis not present

## 2024-06-09 DIAGNOSIS — M51369 Other intervertebral disc degeneration, lumbar region without mention of lumbar back pain or lower extremity pain: Secondary | ICD-10-CM | POA: Diagnosis not present

## 2024-06-09 DIAGNOSIS — R300 Dysuria: Secondary | ICD-10-CM | POA: Diagnosis not present

## 2024-06-13 DIAGNOSIS — E782 Mixed hyperlipidemia: Secondary | ICD-10-CM | POA: Diagnosis not present

## 2024-06-13 DIAGNOSIS — E1169 Type 2 diabetes mellitus with other specified complication: Secondary | ICD-10-CM | POA: Diagnosis not present

## 2024-06-27 DIAGNOSIS — Z79899 Other long term (current) drug therapy: Secondary | ICD-10-CM | POA: Diagnosis not present

## 2024-06-27 DIAGNOSIS — R0609 Other forms of dyspnea: Secondary | ICD-10-CM | POA: Diagnosis not present

## 2024-06-27 DIAGNOSIS — E1169 Type 2 diabetes mellitus with other specified complication: Secondary | ICD-10-CM | POA: Diagnosis not present

## 2024-06-27 DIAGNOSIS — E782 Mixed hyperlipidemia: Secondary | ICD-10-CM | POA: Diagnosis not present

## 2024-06-27 DIAGNOSIS — D72829 Elevated white blood cell count, unspecified: Secondary | ICD-10-CM | POA: Diagnosis not present
